# Patient Record
Sex: Female | Born: 1950 | Race: White | Hispanic: No | Marital: Married | Smoking: Never smoker
Health system: Southern US, Community
[De-identification: ages and names within clinical notes are randomized; demographics above are authoritative.]

## PROBLEM LIST (undated history)

## (undated) DIAGNOSIS — I499 Cardiac arrhythmia, unspecified: Secondary | ICD-10-CM

## (undated) DIAGNOSIS — K219 Gastro-esophageal reflux disease without esophagitis: Secondary | ICD-10-CM

## (undated) DIAGNOSIS — T8859XA Other complications of anesthesia, initial encounter: Secondary | ICD-10-CM

## (undated) DIAGNOSIS — F419 Anxiety disorder, unspecified: Secondary | ICD-10-CM

## (undated) DIAGNOSIS — I10 Essential (primary) hypertension: Secondary | ICD-10-CM

## (undated) DIAGNOSIS — I4891 Unspecified atrial fibrillation: Secondary | ICD-10-CM

## (undated) DIAGNOSIS — M1990 Unspecified osteoarthritis, unspecified site: Secondary | ICD-10-CM

## (undated) DIAGNOSIS — R112 Nausea with vomiting, unspecified: Secondary | ICD-10-CM

## (undated) DIAGNOSIS — Z8739 Personal history of other diseases of the musculoskeletal system and connective tissue: Secondary | ICD-10-CM

## (undated) DIAGNOSIS — Z9889 Other specified postprocedural states: Secondary | ICD-10-CM

## (undated) HISTORY — DX: Unspecified atrial fibrillation: I48.91

## (undated) HISTORY — DX: Anxiety disorder, unspecified: F41.9

## (undated) HISTORY — DX: Personal history of other diseases of the musculoskeletal system and connective tissue: Z87.39

---

## 1998-07-08 HISTORY — PX: KNEE SURGERY: SHX244

## 2017-05-07 ENCOUNTER — Encounter: Payer: Self-pay | Admitting: Gastroenterology

## 2017-07-02 NOTE — Progress Notes (Signed)
Added medications from referral paperwork

## 2017-07-11 ENCOUNTER — Ambulatory Visit: Payer: Self-pay | Admitting: Gastroenterology

## 2020-05-11 ENCOUNTER — Ambulatory Visit: Payer: Self-pay | Admitting: Student

## 2020-05-22 ENCOUNTER — Ambulatory Visit: Payer: Self-pay | Admitting: Student

## 2020-05-22 NOTE — H&P (Signed)
TOTAL HIP ADMISSION H&P  Patient is admitted for right total hip arthroplasty.  Subjective:  Chief Complaint: right hip pain  HPI: Melissa Price, 69 y.o. female, has a history of pain and functional disability in the right hip(s) due to arthritis and patient has failed non-surgical conservative treatments for greater than 12 weeks to include NSAID's and/or analgesics, corticosteriod injections and activity modification.  Onset of symptoms was gradual starting 3 years ago with gradually worsening course since that time.The patient noted no past surgery on the right hip(s).  Patient currently rates pain in the right hip at 8 out of 10 with activity. Patient has worsening of pain with activity and weight bearing, pain that interfers with activities of daily living and pain with passive range of motion. Patient has evidence of subchondral sclerosis and joint space narrowing by imaging studies. This condition presents safety issues increasing the risk of falls. There is no current active infection.  There are no problems to display for this patient.  Past Medical History:  Diagnosis Date  . Arthritis   . Atrial fibrillation (HCC)   . Complication of anesthesia   . Dysrhythmia    afib   . GERD (gastroesophageal reflux disease)   . History of osteopenia    both hips  . Hypertension   . PONV (postoperative nausea and vomiting)     Past Surgical History:  Procedure Laterality Date  . KNEE SURGERY Right 2000   Dr. Ronney Asters - Arthroscopic    No current facility-administered medications for this visit.   No current outpatient medications on file.   Facility-Administered Medications Ordered in Other Visits  Medication Dose Route Frequency Provider Last Rate Last Admin  . 0.9 %  sodium chloride infusion   Intravenous Continuous Barrie Dunker B, PA      . acetaminophen (TYLENOL) tablet 1,000 mg  1,000 mg Oral Once Ellender, Catheryn Bacon, MD      . lactated ringers infusion   Intravenous  Continuous Bethena Midget, MD 10 mL/hr at 06/14/20 0653 New Bag at 06/14/20 0277  . povidone-iodine 10 % swab 2 application  2 application Topical Once Barrie Dunker B, [place]      . tranexamic acid (CYKLOKAPRON) IVPB 1,000 mg  1,000 mg Intravenous To OR Darrick Grinder, PA      . vancomycin (VANCOCIN) IVPB 1000 mg/200 mL premix  1,000 mg Intravenous On Call to OR Barrie Dunker B, PA   1,000 mg at 06/14/20 0745   Allergies  Allergen Reactions  . Penicillins Shortness Of Breath    amoxicillin    Social History   Tobacco Use  . Smoking status: Never Smoker  . Smokeless tobacco: Never Used  Substance Use Topics  . Alcohol use: Yes    Comment: occasoinal     Family History  Problem Relation Age of Onset  . Heart disease Mother   . Heart disease Father      Review of Systems  Musculoskeletal: Positive for arthralgias.  All other systems reviewed and are negative.   Objective:  Physical Exam Constitutional:      Appearance: Normal appearance.  HENT:     Head: Normocephalic.  Eyes:     Pupils: Pupils are equal, round, and reactive to light.  Cardiovascular:     Rate and Rhythm: Normal rate.     Heart sounds: Normal heart sounds.  Pulmonary:     Breath sounds: Normal breath sounds.  Abdominal:     Palpations: Abdomen is soft.  Tenderness: There is no abdominal tenderness.  Genitourinary:    Comments: Deferred Musculoskeletal:     Cervical back: Normal range of motion.     Comments: Examination of the right hip reveals no skin wounds or lesions. Mild trochanteric tenderness to palpation. Restricted range of motion of the hip. Pain with terminal flexion and rotation. Pain in the position of impingement. Positive Stinchfield.  Skin:    General: Skin is warm and dry.  Neurological:     Mental Status: She is alert and oriented to person, place, and time.  Psychiatric:        Mood and Affect: Mood normal.     Vital signs in last 24  hours: @VSRANGES @  Labs:   Estimated body mass index is 22.64 kg/m as calculated from the following:   Height as of 06/14/20: 5\' 3"  (1.6 m).   Weight as of 06/14/20: 58 kg.   Imaging Review Plain radiographs demonstrate severe degenerative joint disease of the right hip(s). The bone quality appears to be adequate for age and reported activity level.      Assessment/Plan:  End stage arthritis, right hip(s)  The patient history, physical examination, clinical judgement of the provider and imaging studies are consistent with end stage degenerative joint disease of the right hip(s) and total hip arthroplasty is deemed medically necessary. The treatment options including medical management, injection therapy, arthroscopy and arthroplasty were discussed at length. The risks and benefits of total hip arthroplasty were presented and reviewed. The risks due to aseptic loosening, infection, stiffness, dislocation/subluxation,  thromboembolic complications and other imponderables were discussed.  The patient acknowledged the explanation, agreed to proceed with the plan and consent was signed. Patient is being admitted for inpatient treatment for surgery, pain control, PT, OT, prophylactic antibiotics, VTE prophylaxis, progressive ambulation and ADL's and discharge planning.The patient is planning to be discharged home after overnight observation where she will complete a home exercise program

## 2020-05-22 NOTE — H&P (View-Only) (Signed)
TOTAL HIP ADMISSION H&P  Patient is admitted for right total hip arthroplasty.  Subjective:  Chief Complaint: right hip pain  HPI: Melissa Price, 69 y.o. female, has a history of pain and functional disability in the right hip(s) due to arthritis and patient has failed non-surgical conservative treatments for greater than 12 weeks to include NSAID's and/or analgesics, corticosteriod injections and activity modification.  Onset of symptoms was gradual starting 3 years ago with gradually worsening course since that time.The patient noted no past surgery on the right hip(s).  Patient currently rates pain in the right hip at 8 out of 10 with activity. Patient has worsening of pain with activity and weight bearing, pain that interfers with activities of daily living and pain with passive range of motion. Patient has evidence of subchondral sclerosis and joint space narrowing by imaging studies. This condition presents safety issues increasing the risk of falls. There is no current active infection.  There are no problems to display for this patient.  Past Medical History:  Diagnosis Date  . Arthritis   . Atrial fibrillation (HCC)   . Complication of anesthesia   . Dysrhythmia    afib   . GERD (gastroesophageal reflux disease)   . History of osteopenia    both hips  . Hypertension   . PONV (postoperative nausea and vomiting)     Past Surgical History:  Procedure Laterality Date  . KNEE SURGERY Right 2000   Dr. Ronney Asters - Arthroscopic    No current facility-administered medications for this visit.   No current outpatient medications on file.   Facility-Administered Medications Ordered in Other Visits  Medication Dose Route Frequency Provider Last Rate Last Admin  . 0.9 %  sodium chloride infusion   Intravenous Continuous Barrie Dunker B, PA      . acetaminophen (TYLENOL) tablet 1,000 mg  1,000 mg Oral Once Ellender, Catheryn Bacon, MD      . lactated ringers infusion   Intravenous  Continuous Bethena Midget, MD 10 mL/hr at 06/14/20 0653 New Bag at 06/14/20 0277  . povidone-iodine 10 % swab 2 application  2 application Topical Once Barrie Dunker B, [place]      . tranexamic acid (CYKLOKAPRON) IVPB 1,000 mg  1,000 mg Intravenous To OR Darrick Grinder, PA      . vancomycin (VANCOCIN) IVPB 1000 mg/200 mL premix  1,000 mg Intravenous On Call to OR Barrie Dunker B, PA   1,000 mg at 06/14/20 0745   Allergies  Allergen Reactions  . Penicillins Shortness Of Breath    amoxicillin    Social History   Tobacco Use  . Smoking status: Never Smoker  . Smokeless tobacco: Never Used  Substance Use Topics  . Alcohol use: Yes    Comment: occasoinal     Family History  Problem Relation Age of Onset  . Heart disease Mother   . Heart disease Father      Review of Systems  Musculoskeletal: Positive for arthralgias.  All other systems reviewed and are negative.   Objective:  Physical Exam Constitutional:      Appearance: Normal appearance.  HENT:     Head: Normocephalic.  Eyes:     Pupils: Pupils are equal, round, and reactive to light.  Cardiovascular:     Rate and Rhythm: Normal rate.     Heart sounds: Normal heart sounds.  Pulmonary:     Breath sounds: Normal breath sounds.  Abdominal:     Palpations: Abdomen is soft.  Tenderness: There is no abdominal tenderness.  Genitourinary:    Comments: Deferred Musculoskeletal:     Cervical back: Normal range of motion.     Comments: Examination of the right hip reveals no skin wounds or lesions. Mild trochanteric tenderness to palpation. Restricted range of motion of the hip. Pain with terminal flexion and rotation. Pain in the position of impingement. Positive Stinchfield.  Skin:    General: Skin is warm and dry.  Neurological:     Mental Status: She is alert and oriented to person, place, and time.  Psychiatric:        Mood and Affect: Mood normal.     Vital signs in last 24  hours: @VSRANGES@  Labs:   Estimated body mass index is 22.64 kg/m as calculated from the following:   Height as of 06/14/20: 5' 3" (1.6 m).   Weight as of 06/14/20: 58 kg.   Imaging Review Plain radiographs demonstrate severe degenerative joint disease of the right hip(s). The bone quality appears to be adequate for age and reported activity level.      Assessment/Plan:  End stage arthritis, right hip(s)  The patient history, physical examination, clinical judgement of the provider and imaging studies are consistent with end stage degenerative joint disease of the right hip(s) and total hip arthroplasty is deemed medically necessary. The treatment options including medical management, injection therapy, arthroscopy and arthroplasty were discussed at length. The risks and benefits of total hip arthroplasty were presented and reviewed. The risks due to aseptic loosening, infection, stiffness, dislocation/subluxation,  thromboembolic complications and other imponderables were discussed.  The patient acknowledged the explanation, agreed to proceed with the plan and consent was signed. Patient is being admitted for inpatient treatment for surgery, pain control, PT, OT, prophylactic antibiotics, VTE prophylaxis, progressive ambulation and ADL's and discharge planning.The patient is planning to be discharged home after overnight observation where she will complete a home exercise program   

## 2020-06-06 ENCOUNTER — Other Ambulatory Visit: Payer: Self-pay

## 2020-06-06 ENCOUNTER — Encounter (HOSPITAL_COMMUNITY): Payer: Self-pay

## 2020-06-06 ENCOUNTER — Encounter (HOSPITAL_COMMUNITY)
Admission: RE | Admit: 2020-06-06 | Discharge: 2020-06-06 | Disposition: A | Discharge status: Home / Self Care | Payer: Medicare PPO | Source: Ambulatory Visit | Attending: Orthopedic Surgery | Admitting: Orthopedic Surgery

## 2020-06-06 DIAGNOSIS — Z01818 Encounter for other preprocedural examination: Secondary | ICD-10-CM | POA: Diagnosis present

## 2020-06-06 HISTORY — DX: Cardiac arrhythmia, unspecified: I49.9

## 2020-06-06 HISTORY — DX: Essential (primary) hypertension: I10

## 2020-06-06 HISTORY — DX: Unspecified osteoarthritis, unspecified site: M19.90

## 2020-06-06 HISTORY — DX: Other specified postprocedural states: Z98.890

## 2020-06-06 HISTORY — DX: Other complications of anesthesia, initial encounter: T88.59XA

## 2020-06-06 HISTORY — DX: Gastro-esophageal reflux disease without esophagitis: K21.9

## 2020-06-06 HISTORY — DX: Other specified postprocedural states: R11.2

## 2020-06-06 LAB — COMPREHENSIVE METABOLIC PANEL
ALT: 17 U/L (ref 0–44)
AST: 21 U/L (ref 15–41)
Albumin: 4.5 g/dL (ref 3.5–5.0)
Alkaline Phosphatase: 75 U/L (ref 38–126)
Anion gap: 8 (ref 5–15)
BUN: 18 mg/dL (ref 8–23)
CO2: 27 mmol/L (ref 22–32)
Calcium: 9.8 mg/dL (ref 8.9–10.3)
Chloride: 102 mmol/L (ref 98–111)
Creatinine, Ser: 0.83 mg/dL (ref 0.44–1.00)
GFR, Estimated: >60 mL/min (ref >60)
Glucose, Bld: 113 mg/dL — ABNORMAL HIGH (ref 70–99)
Potassium: 4.4 mmol/L (ref 3.5–5.1)
Sodium: 137 mmol/L (ref 135–145)
Total Bilirubin: 0.5 mg/dL (ref 0.3–1.2)
Total Protein: 7.2 g/dL (ref 6.5–8.1)

## 2020-06-06 LAB — URINALYSIS, ROUTINE W REFLEX MICROSCOPIC
Bilirubin Urine: NEGATIVE
Glucose, UA: NEGATIVE mg/dL
Hgb urine dipstick: NEGATIVE
Ketones, ur: NEGATIVE mg/dL
Leukocytes,Ua: NEGATIVE
Nitrite: NEGATIVE
Protein, ur: NEGATIVE mg/dL
Specific Gravity, Urine: 1.014 (ref 1.005–1.030)
pH: 5 (ref 5.0–8.0)

## 2020-06-06 LAB — CBC
HCT: 37.7 % (ref 36.0–46.0)
Hemoglobin: 12.2 g/dL (ref 12.0–15.0)
MCH: 32.4 pg (ref 26.0–34.0)
MCHC: 32.4 g/dL (ref 30.0–36.0)
MCV: 100 fL (ref 80.0–100.0)
Platelets: 289 10*3/uL (ref 150–400)
RBC: 3.77 MIL/uL — ABNORMAL LOW (ref 3.87–5.11)
RDW: 13 % (ref 11.5–15.5)
WBC: 5.7 10*3/uL (ref 4.0–10.5)
nRBC: 0 % (ref 0.0–0.2)

## 2020-06-06 LAB — PROTIME-INR
INR: 1.3 — ABNORMAL HIGH (ref 0.8–1.2)
Prothrombin Time: 16 seconds — ABNORMAL HIGH (ref 11.4–15.2)

## 2020-06-06 LAB — SURGICAL PCR SCREEN
MRSA, PCR: NEGATIVE
Staphylococcus aureus: NEGATIVE

## 2020-06-06 NOTE — Progress Notes (Addendum)
Anesthesia Review:  PCP: Cardiologist : Melissa Price [phone] Chest x-ray : EKG :12/26/19 - have requested from [place] by fax with confirmation on chart - EKG on chart.  Echo : Stress test: Cardiac Cath :  Activity level: can do a flight of stairs without difficulty  Sleep Study/ CPAP : Fasting Blood Sugar :      / Checks Blood Sugar -- times a day:   Blood Thinner/ Instructions /Last Dose: ASA / Instructions/ Last Dose :  Xarelto- lst dose on 06/11/2020 per pt  Have requested clearances and info from Surgery scheduler and office of DR Orpah Greek on 06/06/2020  Clearance from DR Orpah Greek dated 05/03/2020 along with LOV of 03/23/2020 on chart

## 2020-06-06 NOTE — Progress Notes (Signed)
DUE TO COVID-19 ONLY ONE VISITOR IS ALLOWED TO COME WITH YOU AND STAY IN THE WAITING ROOM ONLY DURING PRE OP AND PROCEDURE DAY OF SURGERY. THE 1 VISITOR  MAY VISIT WITH YOU AFTER SURGERY IN YOUR PRIVATE ROOM DURING VISITING HOURS ONLY!  YOU NEED TO HAVE A COVID 19 TEST ON12/10/2019 _______ @_______ , THIS TEST MUST BE DONE BEFORE SURGERY,  COVID TESTING SITE [address] [place] , IT IS ON THE RIGHT GOING OUT [address] APPROXIMATELY  2 MINUTES PAST [employer] ON THE RIGHT. ONCE YOUR COVID TEST IS COMPLETED,  PLEASE BEGIN THE QUARANTINE INSTRUCTIONS AS OUTLINED IN YOUR HANDOUT.                Melissa Price  06/06/2020   Your procedure is scheduled on: 06/14/2020   Report to [facility] Main  Entrance   Report to admitting at     0600AM  AM     Call this number if you have problems the morning of surgery [phone]    REMEMBER: NO  SOLID FOOD CANDY OR GUM AFTER MIDNIGHT. CLEAR LIQUIDS UNTIL     0530AM        . NOTHING BY MOUTH EXCEPT CLEAR LIQUIDS UNTIL    . PLEASE FINISH ENSURE DRINK PER SURGEON ORDER  WHICH NEEDS TO BE COMPLETED AT  0530AM     .      CLEAR LIQUID DIET   Foods Allowed                                                                    Coffee and tea, regular and decaf                            Fruit ices (not with fruit pulp)                                      Iced Popsicles                                    Carbonated beverages, regular and diet                                    Cranberry, grape and apple juices Sports drinks like Gatorade Lightly seasoned clear broth or consume(fat free) Sugar, honey syrup ___________________________________________________________________      BRUSH YOUR TEETH MORNING OF SURGERY AND RINSE YOUR MOUTH OUT, NO CHEWING GUM CANDY OR MINTS.     Take these medicines the morning of surgery with A SIP OF WATER:  AMLODIPINE, METOPROLOL, EYE DROPS AS USUAL  DO NOT TAKE ANY DIABETIC  MEDICATIONS DAY OF YOUR SURGERY                               You may not have any metal on your body including hair pins and  piercings  Do not wear jewelry, make-up, lotions, powders or perfumes, deodorant             Do not wear nail polish on your fingernails.  Do not shave  48 hours prior to surgery.              Men may shave face and neck.   Do not bring valuables to the hospital. [place].  Contacts, dentures or bridgework may not be worn into surgery.  Leave suitcase in the car. After surgery it may be brought to your room.     Patients discharged the day of surgery will not be allowed to drive home. IF YOU ARE HAVING SURGERY AND GOING HOME THE SAME DAY, YOU MUST HAVE AN ADULT TO DRIVE YOU HOME AND BE WITH YOU FOR 24 HOURS. YOU MAY GO HOME BY TAXI OR UBER OR ORTHERWISE, BUT AN ADULT MUST ACCOMPANY YOU HOME AND STAY WITH YOU FOR 24 HOURS.  Name and phone number of your driver:  Special Instructions: N/A              Please read over the following fact sheets you were given: _____________________________________________________________________  [facility] - Preparing for Surgery Before surgery, you can play an important role.  Because skin is not sterile, your skin needs to be as free of germs as possible.  You can reduce the number of germs on your skin by washing with CHG (chlorahexidine gluconate) soap before surgery.  CHG is an antiseptic cleaner which kills germs and bonds with the skin to continue killing germs even after washing. Please DO NOT use if you have an allergy to CHG or antibacterial soaps.  If your skin becomes reddened/irritated stop using the CHG and inform your nurse when you arrive at Short Stay. Do not shave (including legs and underarms) for at least 48 hours prior to the first CHG shower.  You may shave your face/neck. Please follow these instructions carefully:  1.  Shower with CHG Soap the night  before surgery and the  morning of Surgery.  2.  If you choose to wash your hair, wash your hair first as usual with your  normal  shampoo.  3.  After you shampoo, rinse your hair and body thoroughly to remove the  shampoo.                           4.  Use CHG as you would any other liquid soap.  You can apply chg directly  to the skin and wash                       Gently with a scrungie or clean washcloth.  5.  Apply the CHG Soap to your body ONLY FROM THE NECK DOWN.   Do not use on face/ open                           Wound or open sores. Avoid contact with eyes, ears mouth and genitals (private parts).                       Wash face,  Genitals (private parts) with your normal soap.             6.  Wash thoroughly, paying special attention to the area where your surgery  will be performed.  7.  Thoroughly rinse your body with warm water from the neck down.  8.  DO NOT shower/wash with your normal soap after using and rinsing off  the CHG Soap.                9.  Pat yourself dry with a clean towel.            10.  Wear clean pajamas.            11.  Place clean sheets on your bed the night of your first shower and do not  sleep with pets. Day of Surgery : Do not apply any lotions/deodorants the morning of surgery.  Please wear clean clothes to the hospital/surgery center.  FAILURE TO FOLLOW THESE INSTRUCTIONS MAY RESULT IN THE CANCELLATION OF YOUR SURGERY PATIENT SIGNATURE_________________________________  NURSE SIGNATURE__________________________________  ________________________________________________________________________

## 2020-06-10 ENCOUNTER — Other Ambulatory Visit (HOSPITAL_COMMUNITY)
Admission: RE | Admit: 2020-06-10 | Discharge: 2020-06-10 | Disposition: A | Discharge status: Home / Self Care | Payer: Medicare PPO | Source: Ambulatory Visit | Attending: Orthopedic Surgery | Admitting: Orthopedic Surgery

## 2020-06-10 DIAGNOSIS — Z01812 Encounter for preprocedural laboratory examination: Secondary | ICD-10-CM | POA: Insufficient documentation

## 2020-06-10 DIAGNOSIS — Z20822 Contact with and (suspected) exposure to covid-19: Secondary | ICD-10-CM | POA: Insufficient documentation

## 2020-06-10 LAB — SARS CORONAVIRUS 2 (TAT 6-24 HRS): SARS Coronavirus 2: NEGATIVE

## 2020-06-12 NOTE — Progress Notes (Signed)
Left another voice mail message asking pt to call nurse back in PST about misplaced instructions.

## 2020-06-12 NOTE — Progress Notes (Signed)
Pt called and Left message with Scheduler stating she had misplaced her preop instructions and Ensure fo rsurgery. LVMM for pt to call me back.

## 2020-06-13 NOTE — Anesthesia Preprocedure Evaluation (Addendum)
Anesthesia Evaluation  Patient identified by MRN, date of birth, ID band Patient awake    Reviewed: Allergy & Precautions, NPO status , Patient's Chart, lab work & pertinent test results  History of Anesthesia Complications (+) PONV and history of anesthetic complications  Airway Mallampati: I  TM Distance: >3 FB Neck ROM: Full    Dental no notable dental hx.    Pulmonary neg pulmonary ROS,    Pulmonary exam normal breath sounds clear to auscultation       Cardiovascular hypertension, Pt. on medications and Pt. on home beta blockers Normal cardiovascular exam+ dysrhythmias Atrial Fibrillation  Rhythm:Regular Rate:Normal     Neuro/Psych negative neurological ROS  negative psych ROS   GI/Hepatic negative GI ROS, Neg liver ROS,   Endo/Other  negative endocrine ROS  Renal/GU negative Renal ROS     Musculoskeletal  (+) Arthritis ,   Abdominal   Peds  Hematology  (+) Blood dyscrasia, , HLD   Anesthesia Other Findings degenerative joint disease right hip  Reproductive/Obstetrics                            Anesthesia Physical Anesthesia Plan  ASA: III  Anesthesia Plan: General   Post-op Pain Management:    Induction: Intravenous  PONV Risk Score and Plan: 4 or greater and Ondansetron, Dexamethasone, Treatment may vary due to age or medical condition and Propofol infusion  Airway Management Planned: Oral ETT  Additional Equipment:   Intra-op Plan:   Post-operative Plan: Extubation in OR  Informed Consent: I have reviewed the patients History and Physical, chart, labs and discussed the procedure including the risks, benefits and alternatives for the proposed anesthesia with the patient or authorized representative who has indicated his/her understanding and acceptance.     Dental advisory given  Plan Discussed with: CRNA  Anesthesia Plan Comments: (Last dose of rivaroxaban  around 10 pm on Sunday 06/11/20)        Anesthesia Quick Evaluation

## 2020-06-14 ENCOUNTER — Ambulatory Visit (HOSPITAL_COMMUNITY): Payer: Medicare PPO | Admitting: Physician Assistant

## 2020-06-14 ENCOUNTER — Encounter (HOSPITAL_COMMUNITY)
Admission: AD | Disposition: A | Discharge status: Home / Self Care | Payer: Self-pay | Source: Home / Self Care | Attending: Orthopedic Surgery

## 2020-06-14 ENCOUNTER — Ambulatory Visit (HOSPITAL_COMMUNITY): Payer: Medicare PPO

## 2020-06-14 ENCOUNTER — Ambulatory Visit (HOSPITAL_COMMUNITY): Payer: Medicare PPO | Admitting: Anesthesiology

## 2020-06-14 ENCOUNTER — Inpatient Hospital Stay (HOSPITAL_COMMUNITY)
Admission: AD | Admit: 2020-06-14 | Discharge: 2020-06-17 | DRG: 470 | Disposition: A | Discharge status: Home / Self Care | Payer: Medicare PPO | Attending: Orthopedic Surgery | Admitting: Orthopedic Surgery

## 2020-06-14 ENCOUNTER — Other Ambulatory Visit: Payer: Self-pay

## 2020-06-14 ENCOUNTER — Encounter (HOSPITAL_COMMUNITY): Payer: Self-pay | Admitting: Orthopedic Surgery

## 2020-06-14 DIAGNOSIS — I493 Ventricular premature depolarization: Secondary | ICD-10-CM

## 2020-06-14 DIAGNOSIS — I495 Sick sinus syndrome: Secondary | ICD-10-CM

## 2020-06-14 DIAGNOSIS — I959 Hypotension, unspecified: Secondary | ICD-10-CM | POA: Diagnosis not present

## 2020-06-14 DIAGNOSIS — Z20822 Contact with and (suspected) exposure to covid-19: Secondary | ICD-10-CM | POA: Diagnosis present

## 2020-06-14 DIAGNOSIS — D62 Acute posthemorrhagic anemia: Secondary | ICD-10-CM | POA: Diagnosis not present

## 2020-06-14 DIAGNOSIS — S72041A Displaced fracture of base of neck of right femur, initial encounter for closed fracture: Secondary | ICD-10-CM

## 2020-06-14 DIAGNOSIS — K219 Gastro-esophageal reflux disease without esophagitis: Secondary | ICD-10-CM | POA: Diagnosis present

## 2020-06-14 DIAGNOSIS — E1165 Type 2 diabetes mellitus with hyperglycemia: Secondary | ICD-10-CM | POA: Diagnosis present

## 2020-06-14 DIAGNOSIS — D649 Anemia, unspecified: Secondary | ICD-10-CM

## 2020-06-14 DIAGNOSIS — R001 Bradycardia, unspecified: Secondary | ICD-10-CM | POA: Diagnosis not present

## 2020-06-14 DIAGNOSIS — I272 Pulmonary hypertension, unspecified: Secondary | ICD-10-CM

## 2020-06-14 DIAGNOSIS — E785 Hyperlipidemia, unspecified: Secondary | ICD-10-CM | POA: Diagnosis present

## 2020-06-14 DIAGNOSIS — Z7901 Long term (current) use of anticoagulants: Secondary | ICD-10-CM

## 2020-06-14 DIAGNOSIS — Z09 Encounter for follow-up examination after completed treatment for conditions other than malignant neoplasm: Secondary | ICD-10-CM

## 2020-06-14 DIAGNOSIS — E871 Hypo-osmolality and hyponatremia: Secondary | ICD-10-CM | POA: Diagnosis present

## 2020-06-14 DIAGNOSIS — E860 Dehydration: Secondary | ICD-10-CM | POA: Diagnosis present

## 2020-06-14 DIAGNOSIS — M1611 Unilateral primary osteoarthritis, right hip: Principal | ICD-10-CM | POA: Diagnosis present

## 2020-06-14 DIAGNOSIS — Z79899 Other long term (current) drug therapy: Secondary | ICD-10-CM

## 2020-06-14 DIAGNOSIS — I498 Other specified cardiac arrhythmias: Secondary | ICD-10-CM

## 2020-06-14 DIAGNOSIS — I471 Supraventricular tachycardia: Secondary | ICD-10-CM | POA: Diagnosis not present

## 2020-06-14 DIAGNOSIS — I480 Paroxysmal atrial fibrillation: Secondary | ICD-10-CM | POA: Diagnosis present

## 2020-06-14 DIAGNOSIS — I10 Essential (primary) hypertension: Secondary | ICD-10-CM | POA: Diagnosis present

## 2020-06-14 HISTORY — PX: TOTAL HIP ARTHROPLASTY: SHX124

## 2020-06-14 LAB — ABO/RH: ABO/RH(D): O NEG

## 2020-06-14 SURGERY — ARTHROPLASTY, HIP, TOTAL, ANTERIOR APPROACH
Anesthesia: General | Site: Hip | Laterality: Right

## 2020-06-14 MED ORDER — ONDANSETRON HCL 4 MG PO TABS: 4.0000 mg | ORAL_TABLET | Freq: Four times a day (QID) | ORAL | Status: DC | PRN

## 2020-06-14 MED ORDER — PROPOFOL 10 MG/ML IV BOLUS
INTRAVENOUS | Status: DC | PRN
  Administered 2020-06-14: 100 mg via INTRAVENOUS

## 2020-06-14 MED ORDER — MENTHOL 3 MG MT LOZG: 1.0000 | LOZENGE | OROMUCOSAL | Status: DC | PRN

## 2020-06-14 MED ORDER — ROCURONIUM BROMIDE 10 MG/ML (PF) SYRINGE
PREFILLED_SYRINGE | INTRAVENOUS | Status: AC
  Filled 2020-06-14: qty 10

## 2020-06-14 MED ORDER — ALBUMIN HUMAN 5 % IV SOLN
INTRAVENOUS | Status: AC
  Filled 2020-06-14: qty 250

## 2020-06-14 MED ORDER — PHENYLEPHRINE 40 MCG/ML (10ML) SYRINGE FOR IV PUSH (FOR BLOOD PRESSURE SUPPORT)
PREFILLED_SYRINGE | INTRAVENOUS | Status: DC | PRN
  Administered 2020-06-14 (×3): 40 ug via INTRAVENOUS
  Administered 2020-06-14 (×2): 80 ug via INTRAVENOUS
  Administered 2020-06-14: 40 ug via INTRAVENOUS

## 2020-06-14 MED ORDER — EPHEDRINE SULFATE-NACL 50-0.9 MG/10ML-% IV SOSY
PREFILLED_SYRINGE | INTRAVENOUS | Status: DC | PRN
  Administered 2020-06-14 (×2): 5 mg via INTRAVENOUS

## 2020-06-14 MED ORDER — ATORVASTATIN CALCIUM 40 MG PO TABS
40.0000 mg | ORAL_TABLET | Freq: Every day | ORAL | Status: DC
  Administered 2020-06-14 – 2020-06-16 (×3): 40 mg via ORAL
  Filled 2020-06-14 (×3): qty 1

## 2020-06-14 MED ORDER — KETOROLAC TROMETHAMINE 30 MG/ML IJ SOLN
INTRAMUSCULAR | Status: DC | PRN
  Administered 2020-06-14: 30 mg via INTRAVENOUS

## 2020-06-14 MED ORDER — ONDANSETRON HCL 4 MG/2ML IJ SOLN
INTRAMUSCULAR | Status: AC
  Filled 2020-06-14: qty 2

## 2020-06-14 MED ORDER — TRANEXAMIC ACID-NACL 1000-0.7 MG/100ML-% IV SOLN
1000.0000 mg | INTRAVENOUS | Status: AC
  Administered 2020-06-14: 1000 mg via INTRAVENOUS
  Filled 2020-06-14: qty 100

## 2020-06-14 MED ORDER — CLINDAMYCIN PHOSPHATE 600 MG/50ML IV SOLN
600.0000 mg | Freq: Four times a day (QID) | INTRAVENOUS | Status: AC
  Administered 2020-06-14 – 2020-06-15 (×2): 600 mg via INTRAVENOUS
  Filled 2020-06-14 (×2): qty 50

## 2020-06-14 MED ORDER — HYDROCODONE-ACETAMINOPHEN 5-325 MG PO TABS
1.0000 | ORAL_TABLET | ORAL | Status: DC | PRN
  Administered 2020-06-14 – 2020-06-16 (×3): 1 via ORAL
  Administered 2020-06-17 (×2): 2 via ORAL
  Filled 2020-06-14: qty 1
  Filled 2020-06-14 (×2): qty 2
  Filled 2020-06-14 (×2): qty 1

## 2020-06-14 MED ORDER — FENTANYL CITRATE (PF) 100 MCG/2ML IJ SOLN
INTRAMUSCULAR | Status: AC
  Filled 2020-06-14: qty 2

## 2020-06-14 MED ORDER — WATER FOR IRRIGATION, STERILE IR SOLN
Status: DC | PRN
  Administered 2020-06-14: 2000 mL

## 2020-06-14 MED ORDER — ISOPROPYL ALCOHOL 70 % SOLN
Status: AC
  Filled 2020-06-14: qty 480

## 2020-06-14 MED ORDER — MONTELUKAST SODIUM 10 MG PO TABS: 10.0000 mg | ORAL_TABLET | Freq: Every day | ORAL | Status: DC | PRN

## 2020-06-14 MED ORDER — MIDAZOLAM HCL 2 MG/2ML IJ SOLN
INTRAMUSCULAR | Status: AC
  Filled 2020-06-14: qty 2

## 2020-06-14 MED ORDER — SODIUM CHLORIDE (PF) 0.9 % IJ SOLN
INTRAMUSCULAR | Status: AC
  Filled 2020-06-14: qty 50

## 2020-06-14 MED ORDER — ACETAMINOPHEN 10 MG/ML IV SOLN: 1000.0000 mg | Freq: Once | INTRAVENOUS | Status: DC | PRN

## 2020-06-14 MED ORDER — HYDROCODONE-ACETAMINOPHEN 7.5-325 MG PO TABS
1.0000 | ORAL_TABLET | ORAL | Status: DC | PRN
  Administered 2020-06-15 – 2020-06-16 (×3): 1 via ORAL
  Administered 2020-06-16: 2 via ORAL
  Filled 2020-06-14 (×2): qty 1
  Filled 2020-06-14: qty 2
  Filled 2020-06-14: qty 1

## 2020-06-14 MED ORDER — AMLODIPINE BESYLATE 5 MG PO TABS: 5.0000 mg | ORAL_TABLET | Freq: Every day | ORAL | Status: DC

## 2020-06-14 MED ORDER — ALBUMIN HUMAN 5 % IV SOLN: INTRAVENOUS | Status: DC | PRN

## 2020-06-14 MED ORDER — KETOROLAC TROMETHAMINE 30 MG/ML IJ SOLN
INTRAMUSCULAR | Status: AC
  Filled 2020-06-14: qty 1

## 2020-06-14 MED ORDER — CHLORHEXIDINE GLUCONATE 0.12 % MT SOLN
15.0000 mL | Freq: Once | OROMUCOSAL | Status: AC
  Administered 2020-06-14: 15 mL via OROMUCOSAL

## 2020-06-14 MED ORDER — GLYCOPYRROLATE PF 0.2 MG/ML IJ SOSY
PREFILLED_SYRINGE | INTRAMUSCULAR | Status: DC | PRN
  Administered 2020-06-14: .2 mg via INTRAVENOUS

## 2020-06-14 MED ORDER — 0.9 % SODIUM CHLORIDE (POUR BTL) OPTIME
TOPICAL | Status: DC | PRN
  Administered 2020-06-14: 1000 mL

## 2020-06-14 MED ORDER — ISOPROPYL ALCOHOL 70 % SOLN
Status: DC | PRN
  Administered 2020-06-14: 1 via TOPICAL

## 2020-06-14 MED ORDER — PROPOFOL 500 MG/50ML IV EMUL
INTRAVENOUS | Status: AC
  Filled 2020-06-14: qty 50

## 2020-06-14 MED ORDER — SODIUM CHLORIDE 0.9 % IR SOLN
Status: DC | PRN
  Administered 2020-06-14: 3000 mL

## 2020-06-14 MED ORDER — ONDANSETRON HCL 4 MG/2ML IJ SOLN: 4.0000 mg | Freq: Once | INTRAMUSCULAR | Status: DC | PRN

## 2020-06-14 MED ORDER — VANCOMYCIN HCL IN DEXTROSE 1-5 GM/200ML-% IV SOLN
1000.0000 mg | INTRAVENOUS | Status: AC
  Administered 2020-06-14: 1000 mg via INTRAVENOUS
  Filled 2020-06-14: qty 200

## 2020-06-14 MED ORDER — PHENOL 1.4 % MT LIQD: 1.0000 | OROMUCOSAL | Status: DC | PRN

## 2020-06-14 MED ORDER — KETOROLAC TROMETHAMINE 15 MG/ML IJ SOLN
7.5000 mg | Freq: Four times a day (QID) | INTRAMUSCULAR | Status: AC
  Administered 2020-06-14 – 2020-06-15 (×4): 7.5 mg via INTRAVENOUS
  Filled 2020-06-14 (×4): qty 1

## 2020-06-14 MED ORDER — METHOCARBAMOL 500 MG IVPB - SIMPLE MED
500.0000 mg | Freq: Four times a day (QID) | INTRAVENOUS | Status: DC | PRN
  Administered 2020-06-14: 500 mg via INTRAVENOUS
  Filled 2020-06-14: qty 50

## 2020-06-14 MED ORDER — CEFAZOLIN SODIUM-DEXTROSE 2-3 GM-%(50ML) IV SOLR
INTRAVENOUS | Status: DC | PRN
  Administered 2020-06-14: 2 g via INTRAVENOUS

## 2020-06-14 MED ORDER — SODIUM CHLORIDE (PF) 0.9 % IJ SOLN
INTRAMUSCULAR | Status: DC | PRN
  Administered 2020-06-14: 30 mL via INTRAVENOUS

## 2020-06-14 MED ORDER — LIDOCAINE HCL (PF) 2 % IJ SOLN
INTRAMUSCULAR | Status: AC
  Filled 2020-06-14: qty 5

## 2020-06-14 MED ORDER — PROPOFOL 10 MG/ML IV BOLUS
INTRAVENOUS | Status: AC
  Filled 2020-06-14: qty 20

## 2020-06-14 MED ORDER — LIDOCAINE 2% (20 MG/ML) 5 ML SYRINGE
INTRAMUSCULAR | Status: DC | PRN
  Administered 2020-06-14: 60 mg via INTRAVENOUS

## 2020-06-14 MED ORDER — POVIDONE-IODINE 10 % EX SWAB
2.0000 "application " | Freq: Once | CUTANEOUS | Status: AC
  Administered 2020-06-14: 2 via TOPICAL

## 2020-06-14 MED ORDER — MIDAZOLAM HCL 2 MG/2ML IJ SOLN
INTRAMUSCULAR | Status: DC | PRN
  Administered 2020-06-14: 2 mg via INTRAVENOUS

## 2020-06-14 MED ORDER — PHENYLEPHRINE 40 MCG/ML (10ML) SYRINGE FOR IV PUSH (FOR BLOOD PRESSURE SUPPORT)
PREFILLED_SYRINGE | INTRAVENOUS | Status: AC
  Filled 2020-06-14: qty 10

## 2020-06-14 MED ORDER — POVIDONE-IODINE 10 % EX SWAB: 2.0000 "application " | Freq: Once | CUTANEOUS | Status: DC

## 2020-06-14 MED ORDER — BUPIVACAINE-EPINEPHRINE 0.25% -1:200000 IJ SOLN
INTRAMUSCULAR | Status: DC | PRN
  Administered 2020-06-14: 30 mL

## 2020-06-14 MED ORDER — DEXAMETHASONE SODIUM PHOSPHATE 10 MG/ML IJ SOLN
10.0000 mg | Freq: Once | INTRAMUSCULAR | Status: AC
  Administered 2020-06-15: 10 mg via INTRAVENOUS
  Filled 2020-06-14: qty 1

## 2020-06-14 MED ORDER — METOPROLOL TARTRATE 25 MG PO TABS
25.0000 mg | ORAL_TABLET | Freq: Two times a day (BID) | ORAL | Status: DC
  Administered 2020-06-14: 25 mg via ORAL
  Filled 2020-06-14: qty 1

## 2020-06-14 MED ORDER — SENNA 8.6 MG PO TABS
2.0000 | ORAL_TABLET | Freq: Every day | ORAL | Status: DC
  Administered 2020-06-14 – 2020-06-16 (×3): 17.2 mg via ORAL
  Filled 2020-06-14 (×3): qty 2

## 2020-06-14 MED ORDER — ONDANSETRON HCL 4 MG/2ML IJ SOLN
4.0000 mg | Freq: Four times a day (QID) | INTRAMUSCULAR | Status: DC | PRN
  Administered 2020-06-14: 4 mg via INTRAVENOUS
  Filled 2020-06-14: qty 2

## 2020-06-14 MED ORDER — METHOCARBAMOL 500 MG PO TABS
500.0000 mg | ORAL_TABLET | Freq: Four times a day (QID) | ORAL | Status: DC | PRN
  Administered 2020-06-14: 500 mg via ORAL
  Filled 2020-06-14: qty 1

## 2020-06-14 MED ORDER — ONDANSETRON HCL 4 MG/2ML IJ SOLN
INTRAMUSCULAR | Status: DC | PRN
  Administered 2020-06-14: 4 mg via INTRAVENOUS

## 2020-06-14 MED ORDER — ROCURONIUM BROMIDE 10 MG/ML (PF) SYRINGE
PREFILLED_SYRINGE | INTRAVENOUS | Status: DC | PRN
  Administered 2020-06-14: 20 mg via INTRAVENOUS
  Administered 2020-06-14: 40 mg via INTRAVENOUS

## 2020-06-14 MED ORDER — BUPIVACAINE-EPINEPHRINE (PF) 0.25% -1:200000 IJ SOLN
INTRAMUSCULAR | Status: AC
  Filled 2020-06-14: qty 30

## 2020-06-14 MED ORDER — EPHEDRINE 5 MG/ML INJ
INTRAVENOUS | Status: AC
  Filled 2020-06-14: qty 10

## 2020-06-14 MED ORDER — LACTATED RINGERS IV SOLN: INTRAVENOUS | Status: DC

## 2020-06-14 MED ORDER — FENTANYL CITRATE (PF) 250 MCG/5ML IJ SOLN
INTRAMUSCULAR | Status: DC | PRN
  Administered 2020-06-14 (×4): 50 ug via INTRAVENOUS

## 2020-06-14 MED ORDER — METHOCARBAMOL 500 MG IVPB - SIMPLE MED
INTRAVENOUS | Status: AC
  Filled 2020-06-14: qty 50

## 2020-06-14 MED ORDER — SODIUM CHLORIDE 0.9 % IV SOLN: INTRAVENOUS | Status: DC

## 2020-06-14 MED ORDER — ACETAMINOPHEN 500 MG PO TABS: 1000.0000 mg | ORAL_TABLET | Freq: Once | ORAL | Status: DC

## 2020-06-14 MED ORDER — DIPHENHYDRAMINE HCL 12.5 MG/5ML PO ELIX: 12.5000 mg | ORAL_SOLUTION | ORAL | Status: DC | PRN

## 2020-06-14 MED ORDER — METOCLOPRAMIDE HCL 5 MG/ML IJ SOLN: 5.0000 mg | Freq: Three times a day (TID) | INTRAMUSCULAR | Status: DC | PRN

## 2020-06-14 MED ORDER — RIVAROXABAN 10 MG PO TABS: 10.0000 mg | ORAL_TABLET | Freq: Every day | ORAL | Status: DC

## 2020-06-14 MED ORDER — GLYCOPYRROLATE PF 0.2 MG/ML IJ SOSY
PREFILLED_SYRINGE | INTRAMUSCULAR | Status: AC
  Filled 2020-06-14: qty 1

## 2020-06-14 MED ORDER — POLYETHYLENE GLYCOL 3350 17 G PO PACK
17.0000 g | PACK | Freq: Every day | ORAL | Status: DC | PRN
  Administered 2020-06-17: 08:00:00 17 g via ORAL
  Filled 2020-06-14: qty 1

## 2020-06-14 MED ORDER — ACETAMINOPHEN 10 MG/ML IV SOLN
1000.0000 mg | Freq: Once | INTRAVENOUS | Status: AC
  Administered 2020-06-14: 1000 mg via INTRAVENOUS
  Filled 2020-06-14: qty 100

## 2020-06-14 MED ORDER — ORAL CARE MOUTH RINSE: 15.0000 mL | Freq: Once | OROMUCOSAL | Status: AC

## 2020-06-14 MED ORDER — FENTANYL CITRATE (PF) 100 MCG/2ML IJ SOLN
25.0000 ug | INTRAMUSCULAR | Status: DC | PRN
  Administered 2020-06-14: 50 ug via INTRAVENOUS

## 2020-06-14 MED ORDER — ALUM & MAG HYDROXIDE-SIMETH 200-200-20 MG/5ML PO SUSP: 30.0000 mL | ORAL | Status: DC | PRN

## 2020-06-14 MED ORDER — ACETAMINOPHEN 325 MG PO TABS: 325.0000 mg | ORAL_TABLET | Freq: Four times a day (QID) | ORAL | Status: DC | PRN

## 2020-06-14 MED ORDER — FLUTICASONE PROPIONATE 50 MCG/ACT NA SUSP
1.0000 | Freq: Every day | NASAL | Status: DC | PRN
  Filled 2020-06-14: qty 16

## 2020-06-14 MED ORDER — NAPHAZOLINE-GLYCERIN 0.012-0.2 % OP SOLN
1.0000 [drp] | Freq: Four times a day (QID) | OPHTHALMIC | Status: DC | PRN
  Filled 2020-06-14: qty 15

## 2020-06-14 MED ORDER — DEXAMETHASONE SODIUM PHOSPHATE 10 MG/ML IJ SOLN
INTRAMUSCULAR | Status: DC | PRN
  Administered 2020-06-14: 10 mg via INTRAVENOUS

## 2020-06-14 MED ORDER — DEXAMETHASONE SODIUM PHOSPHATE 10 MG/ML IJ SOLN
INTRAMUSCULAR | Status: AC
  Filled 2020-06-14: qty 1

## 2020-06-14 MED ORDER — FENTANYL CITRATE (PF) 100 MCG/2ML IJ SOLN
INTRAMUSCULAR | Status: AC
  Administered 2020-06-14: 50 ug via INTRAVENOUS
  Filled 2020-06-14: qty 2

## 2020-06-14 MED ORDER — DOCUSATE SODIUM 100 MG PO CAPS
100.0000 mg | ORAL_CAPSULE | Freq: Two times a day (BID) | ORAL | Status: DC
  Administered 2020-06-14 – 2020-06-17 (×6): 100 mg via ORAL
  Filled 2020-06-14 (×6): qty 1

## 2020-06-14 MED ORDER — METOCLOPRAMIDE HCL 5 MG PO TABS: 5.0000 mg | ORAL_TABLET | Freq: Three times a day (TID) | ORAL | Status: DC | PRN

## 2020-06-14 MED ORDER — MORPHINE SULFATE (PF) 2 MG/ML IV SOLN: 0.5000 mg | INTRAVENOUS | Status: DC | PRN

## 2020-06-14 MED ORDER — ESCITALOPRAM OXALATE 10 MG PO TABS
10.0000 mg | ORAL_TABLET | Freq: Every evening | ORAL | Status: DC
  Administered 2020-06-14 – 2020-06-16 (×3): 10 mg via ORAL
  Filled 2020-06-14 (×3): qty 1

## 2020-06-14 MED ORDER — CEFAZOLIN SODIUM-DEXTROSE 2-4 GM/100ML-% IV SOLN
INTRAVENOUS | Status: AC
  Filled 2020-06-14: qty 100

## 2020-06-14 SURGICAL SUPPLY — 63 items
BAG DECANTER FOR FLEXI CONT (MISCELLANEOUS) IMPLANT
BAG ZIPLOCK 12X15 (MISCELLANEOUS) IMPLANT
BLADE SURG SZ10 CARB STEEL (BLADE) IMPLANT
CHLORAPREP W/TINT 26 (MISCELLANEOUS) ×3 IMPLANT
COVER PERINEAL POST (MISCELLANEOUS) ×3 IMPLANT
COVER SURGICAL LIGHT HANDLE (MISCELLANEOUS) ×3 IMPLANT
COVER WAND RF STERILE (DRAPES) IMPLANT
DECANTER SPIKE VIAL GLASS SM (MISCELLANEOUS) ×3 IMPLANT
DERMABOND ADVANCED (GAUZE/BANDAGES/DRESSINGS) ×4
DERMABOND ADVANCED .7 DNX12 (GAUZE/BANDAGES/DRESSINGS) ×2 IMPLANT
DRAPE IMP U-DRAPE 54X76 (DRAPES) ×3 IMPLANT
DRAPE SHEET LG 3/4 BI-LAMINATE (DRAPES) ×9 IMPLANT
DRAPE STERI IOBAN 125X83 (DRAPES) IMPLANT
DRAPE U-SHAPE 47X51 STRL (DRAPES) ×6 IMPLANT
DRSG AQUACEL AG ADV 3.5X10 (GAUZE/BANDAGES/DRESSINGS) ×3 IMPLANT
ELECT REM PT RETURN 15FT ADLT (MISCELLANEOUS) ×3 IMPLANT
GAUZE SPONGE 4X4 12PLY STRL (GAUZE/BANDAGES/DRESSINGS) ×3 IMPLANT
GLOVE BIO SURGEON STRL SZ8.5 (GLOVE) ×6 IMPLANT
GLOVE BIOGEL M STRL SZ7.5 (GLOVE) ×6 IMPLANT
GLOVE BIOGEL PI IND STRL 8 (GLOVE) ×1 IMPLANT
GLOVE BIOGEL PI IND STRL 8.5 (GLOVE) ×1 IMPLANT
GLOVE BIOGEL PI INDICATOR 8 (GLOVE) ×2
GLOVE BIOGEL PI INDICATOR 8.5 (GLOVE) ×2
GOWN SPEC L3 XXLG W/TWL (GOWN DISPOSABLE) ×3 IMPLANT
GOWN STRL REUS W/ TWL LRG LVL3 (GOWN DISPOSABLE) ×1 IMPLANT
GOWN STRL REUS W/TWL LRG LVL3 (GOWN DISPOSABLE) ×2
HANDPIECE INTERPULSE COAX TIP (DISPOSABLE) ×2
HEAD FEMORAL 32 CERAMIC (Hips) ×3 IMPLANT
HOLDER FOLEY CATH W/STRAP (MISCELLANEOUS) ×3 IMPLANT
HOOD PEEL AWAY FLYTE STAYCOOL (MISCELLANEOUS) ×12 IMPLANT
JET LAVAGE IRRISEPT WOUND (IRRIGATION / IRRIGATOR) ×3
KIT TURNOVER KIT A (KITS) IMPLANT
LAVAGE JET IRRISEPT WOUND (IRRIGATION / IRRIGATOR) ×1 IMPLANT
LINER PINN ACET NEUT 32X52 ×3 IMPLANT
MANIFOLD NEPTUNE II (INSTRUMENTS) ×3 IMPLANT
MARKER SKIN DUAL TIP RULER LAB (MISCELLANEOUS) ×3 IMPLANT
NDL SAFETY ECLIPSE 18X1.5 (NEEDLE) ×1 IMPLANT
NEEDLE HYPO 18GX1.5 SHARP (NEEDLE) ×2
NEEDLE SPNL 18GX3.5 QUINCKE PK (NEEDLE) ×3 IMPLANT
PACK ANTERIOR HIP CUSTOM (KITS) ×3 IMPLANT
PENCIL SMOKE EVACUATOR (MISCELLANEOUS) IMPLANT
PIN SECTOR W/GRIP ACE CUP 52MM (Hips) ×3 IMPLANT
SAW OSC TIP CART 19.5X105X1.3 (SAW) ×3 IMPLANT
SCREW 6.5MMX30MM (Screw) ×3 IMPLANT
SCREW 6.5MMX35MM (Screw) ×3 IMPLANT
SEALER BIPOLAR AQUA 6.0 (INSTRUMENTS) ×3 IMPLANT
SET HNDPC FAN SPRY TIP SCT (DISPOSABLE) ×1 IMPLANT
STAPLER VISISTAT 35W (STAPLE) ×3 IMPLANT
STEM TRI LOC BPS SZ6 W GRIPTON ×1 IMPLANT
SUT ETHIBOND NAB CT1 #1 30IN (SUTURE) ×6 IMPLANT
SUT MNCRL AB 3-0 PS2 18 (SUTURE) ×3 IMPLANT
SUT MNCRL AB 4-0 PS2 18 (SUTURE) ×3 IMPLANT
SUT MON AB 2-0 CT1 36 (SUTURE) ×6 IMPLANT
SUT STRATAFIX PDO 1 14 VIOLET (SUTURE) ×2
SUT STRATFX PDO 1 14 VIOLET (SUTURE) ×1
SUT VIC AB 2-0 CT1 27 (SUTURE) ×2
SUT VIC AB 2-0 CT1 TAPERPNT 27 (SUTURE) ×1 IMPLANT
SUTURE STRATFX PDO 1 14 VIOLET (SUTURE) ×1 IMPLANT
SYR 3ML LL SCALE MARK (SYRINGE) ×3 IMPLANT
TRAY FOLEY MTR SLVR 16FR STAT (SET/KITS/TRAYS/PACK) IMPLANT
TRI LOC BPS SZ 6 W GRIPTON ×3 IMPLANT
TUBE SUCTION HIGH CAP CLEAR NV (SUCTIONS) ×3 IMPLANT
WATER STERILE IRR 1000ML POUR (IV SOLUTION) ×3 IMPLANT

## 2020-06-14 NOTE — Op Note (Signed)
OPERATIVE REPORT   SURGEON: Samson Frederic, MD   ASSISTANT: Barrie Dunker, PA-C.  PREOPERATIVE DIAGNOSIS: Right hip arthritis.   POSTOPERATIVE DIAGNOSIS: Right hip arthritis.   PROCEDURE: Right total hip arthroplasty, anterior approach.   IMPLANTS: DePuy Tri Lock stem, size 6, hi offset. DePuy Pinnacle Cup, size 52 mm. DePuy Altrx liner, size 32 by 52 mm, neutral. DePuy Biolox ceramic head ball, size 32 + 1 mm. 6.5 mm cancellous bone screws x2.  ANESTHESIA:  General  ESTIMATED BLOOD LOSS:-500 mL    ANTIBIOTICS: 1 g vancomycin (PCN allergy). 900 mg clindamycin.  DRAINS: None.  COMPLICATIONS: None.   CONDITION: PACU - hemodynamically stable.   BRIEF CLINICAL NOTE: Melissa Price is a 69 y.o. female with a long-standing history of Right hip arthritis. After failing conservative management, the patient was indicated for total hip arthroplasty. The risks, benefits, and alternatives to the procedure were explained, and the patient elected to proceed.  PROCEDURE IN DETAIL: Surgical site was marked by myself in the pre-op holding area. Once inside the operating room, spinal anesthesia was obtained, and a foley catheter was inserted. The patient was then positioned on the Hana table.  All bony prominences were well padded.  The hip was prepped and draped in the normal sterile surgical fashion.  A time-out was called verifying side and site of surgery. The patient received IV antibiotics within 60 minutes of beginning the procedure.   The direct anterior approach to the hip was performed through the Hueter interval.  Lateral femoral circumflex vessels were treated with the Auqumantys. The anterior capsule was exposed and an inverted T capsulotomy was made. The femoral neck cut was made to the level of the templated cut.  A corkscrew was placed into the head and the head was removed.  The femoral head was found to have eburnated bone. The head was passed to the back table and was  measured.   Acetabular exposure was achieved, and the pulvinar and labrum were excised. Sequential reaming of the acetabulum was then performed up to a size 51 mm reamer. A 52 mm cup was then opened and impacted into place at approximately 40 degrees of abduction and 20 degrees of anteversion. I elected to augment the acceptable press fit fixation with 6.5 mm cancellous bone screws x2.The final polyethylene liner was impacted into place and acetabular osteophytes were removed.    I then gained femoral exposure taking care to protect the abductors and greater trochanter.  This was performed using standard external rotation, extension, and adduction.  The capsule was peeled off the inner aspect of the greater trochanter, taking care to preserve the short external rotators. A cookie cutter was used to enter the femoral canal, and then the femoral canal finder was placed.  Sequential broaching was performed up to a size 6.  Calcar planer was used on the femoral neck remnant.  I placed a hi offset neck and a trial head ball.  The hip was reduced.  Leg lengths and offset were checked fluoroscopically.  The hip was dislocated and trial components were removed.  The final implants were placed, and the hip was reduced.  Fluoroscopy was used to confirm component position and leg lengths.  At 90 degrees of external rotation and full extension, the hip was stable to an anterior directed force.   The wound was copiously irrigated with Irrisept solution and normal saline using pule lavage.  Marcaine solution was injected into the periarticular soft tissue.  The wound was closed in  layers using #1 Stratafix for the fascia, 2-0 Vicryl for the subcutaneous fat, 2-0 Monocryl for the deep dermal layer,and staples + Dermabond for the skin.  Once the glue was fully dried, an Aquacell Ag dressing was applied.  The patient was transported to the recovery room in stable condition.  Sponge, needle, and instrument counts were correct  at the end of the case x2.  The patient tolerated the procedure well and there were no known complications.  Please note that a surgical assistant was a medical necessity for this procedure to perform it in a safe and expeditious manner. Assistant was necessary to provide appropriate retraction of vital neurovascular structures, to prevent femoral fracture, and to allow for anatomic placement of the prosthesis.

## 2020-06-14 NOTE — Anesthesia Procedure Notes (Signed)
Procedure Name: Intubation Date/Time: 06/14/2020 8:39 AM Performed by: Shary Decamp, CRNA Pre-anesthesia Checklist: Patient identified, Patient being monitored, Timeout performed, Emergency Drugs available and Suction available Patient Re-evaluated:Patient Re-evaluated prior to induction Oxygen Delivery Method: Circle System Utilized Preoxygenation: Pre-oxygenation with 100% oxygen Induction Type: IV induction Ventilation: Mask ventilation without difficulty Laryngoscope Size: Miller and 2 Grade View: Grade I Tube type: Oral Tube size: 7.0 mm Number of attempts: 1 Airway Equipment and Method: Stylet Placement Confirmation: ETT inserted through vocal cords under direct vision,  positive ETCO2 and breath sounds checked- equal and bilateral Secured at: 21 cm Tube secured with: Tape Dental Injury: Teeth and Oropharynx as per pre-operative assessment

## 2020-06-14 NOTE — Transfer of Care (Signed)
Immediate Anesthesia Transfer of Care Note  Patient: Melissa Price  Procedure(s) Performed: TOTAL HIP ARTHROPLASTY ANTERIOR APPROACH (Right Hip)  Patient Location: PACU  Anesthesia Type:General  Level of Consciousness: awake, alert , oriented and patient cooperative  Airway & Oxygen Therapy: Patient Spontanous Breathing and Patient connected to face mask oxygen  Post-op Assessment: Report given to RN, Post -op Vital signs reviewed and stable and Patient moving all extremities  Post vital signs: Reviewed and stable  Last Vitals:  Vitals Value Taken Time  BP 106/59 06/14/20 1107  Temp    Pulse 68 06/14/20 1109  Resp 13 06/14/20 1109  SpO2 100 % 06/14/20 1109  Vitals shown include unvalidated device data.  Last Pain:  Vitals:   06/14/20 0705  TempSrc: Oral  PainSc:       Patients Stated Pain Goal: 4 (06/14/20 7124)  Complications: No complications documented.

## 2020-06-14 NOTE — Anesthesia Postprocedure Evaluation (Signed)
Anesthesia Post Note  Patient: Melissa Price  Procedure(s) Performed: TOTAL HIP ARTHROPLASTY ANTERIOR APPROACH (Right Hip)     Patient location during evaluation: PACU Anesthesia Type: General Level of consciousness: awake Pain management: pain level controlled Vital Signs Assessment: post-procedure vital signs reviewed and stable Respiratory status: spontaneous breathing, nonlabored ventilation, respiratory function stable and patient connected to nasal cannula oxygen Cardiovascular status: blood pressure returned to baseline and stable Postop Assessment: no apparent nausea or vomiting Anesthetic complications: no   No complications documented.  Last Vitals:  Vitals:   06/14/20 1506 06/14/20 1605  BP: (!) 101/48 (!) 91/58  Pulse: (!) 53 (!) 58  Resp: 18 18  Temp: 36.4 C (!) 36.4 C  SpO2: 100% 100%    Last Pain:  Vitals:   06/14/20 1628  TempSrc:   PainSc: 0-No pain                 Onur Mori P Sady Monaco

## 2020-06-14 NOTE — Interval H&P Note (Signed)
History and Physical Interval Note:  06/14/2020 8:00 AM  Clearnce Hasten  has presented today for surgery, with the diagnosis of degenerative joint disease right hip.  The various methods of treatment have been discussed with the patient and family. After consideration of risks, benefits and other options for treatment, the patient has consented to  Procedure(s): TOTAL HIP ARTHROPLASTY ANTERIOR APPROACH (Right) as a surgical intervention.  The patient's history has been reviewed, patient examined, no change in status, stable for surgery.  I have reviewed the patient's chart and labs.  Questions were answered to the patient's satisfaction.    The risks, benefits, and alternatives were discussed with the patient. There are risks associated with the surgery including, but not limited to, problems with anesthesia (death), infection, instability (giving out of the joint), dislocation, differences in leg length/angulation/rotation, fracture of bones, loosening or failure of implants, hematoma (blood accumulation) which may require surgical drainage, blood clots, pulmonary embolism, nerve injury (foot drop and lateral thigh numbness), and blood vessel injury. The patient understands these risks and elects to proceed.    Iline Oven Sereniti Wan

## 2020-06-14 NOTE — Evaluation (Signed)
Physical Therapy Evaluation Patient Details Name: Melissa Price MRN: [id number] DOB: 12-Nov-1950 Today's Date: 06/14/2020   History of Present Illness  Patient is 69 y.o. female s/p Rt THA anterior appraoch on 06/14/20 with PMH significant for HTN, osteopenia, GERD, OA, A-fib.    Clinical Impression  Melissa Price is a 69 y.o. female POD 0 s/p Rt THA. Patient reports independence with mobility at baseline. Patient is now limited by functional impairments (see PT problem list below) and requires min assist for transfers and gait with RW. Patient was able to ambulate ~25 feet with RW and min assist. She was able to void bladder on BSC and required min assist to rise from commode. Patient instructed in exercise to facilitate ROM and circulation. Patient will benefit from continued skilled PT interventions to address impairments and progress towards PLOF. Acute PT will follow to progress mobility and stair training in preparation for safe discharge home.     Follow Up Recommendations Follow surgeon's recommendation for DC plan and follow-up therapies    Equipment Recommendations  Rolling walker with 5" wheels;3in1 (PT)    Recommendations for Other Services       Precautions / Restrictions Precautions Precautions: Fall Restrictions Weight Bearing Restrictions: No Other Position/Activity Restrictions: WBAT      Mobility  Bed Mobility Overal bed mobility: Needs Assistance Bed Mobility: Supine to Sit     Supine to sit: Min assist;HOB elevated     General bed mobility comments: assist for Rt LE to mobilize to EOB, pt using bed rail to pivot and sit up EOB.    Transfers Overall transfer level: Needs assistance Equipment used: Rolling walker (2 wheeled) Transfers: Sit to/from Stand Sit to Stand: Min assist         General transfer comment: cues for hand placement on RW. min assist for power up from EOB and BSC.   Ambulation/Gait Ambulation/Gait assistance: Min assist Gait  Distance (Feet): 25 Feet (in room to/from bathroom) Assistive device: Rolling walker (2 wheeled) Gait Pattern/deviations: Step-to pattern;Decreased stride length Gait velocity: decr   General Gait Details: VC's for step pattern and proximity to RW, no overt LOB. pt reports slight dizziness after walking, BP stable.  Stairs            Wheelchair Mobility    Modified Rankin (Stroke Patients Only)       Balance Overall balance assessment: Needs assistance Sitting-balance support: Feet supported Sitting balance-Leahy Scale: Good     Standing balance support: During functional activity;Bilateral upper extremity supported Standing balance-Leahy Scale: Fair                               Pertinent Vitals/Pain Pain Assessment: 0-10 Pain Score: 2  Pain Location: Rt hip Pain Descriptors / Indicators: Aching Pain Intervention(s): Limited activity within patient's tolerance;Monitored during session;Repositioned;Ice applied    Home Living Family/patient expects to be discharged to:: Private residence Living Arrangements: Spouse/significant other Available Help at Discharge: Family Type of Home: House Home Access: Stairs to enter Entrance Stairs-Rails: None Entrance Stairs-Number of Steps: 2 Home Layout: Two level;Bed/bath upstairs;Full bath on main level Home Equipment: Cane - single point;Shower seat;Grab bars - tub/shower      Prior Function Level of Independence: Independent;Independent with assistive device(s)         Comments: pt has been using SPC since July/August     Hand Dominance   Dominant Hand: Right    Extremity/Trunk Assessment   Upper  Extremity Assessment Upper Extremity Assessment: Overall WFL for tasks assessed    Lower Extremity Assessment Lower Extremity Assessment: Overall WFL for tasks assessed    Cervical / Trunk Assessment Cervical / Trunk Assessment: Normal  Communication   Communication: No difficulties  Cognition  Arousal/Alertness: Awake/alert Behavior During Therapy: WFL for tasks assessed/performed Overall Cognitive Status: Within Functional Limits for tasks assessed                                        General Comments      Exercises Total Joint Exercises Ankle Circles/Pumps: AROM;Both;Seated;20 reps Quad Sets: AROM;Right;10 reps;Seated Heel Slides: AROM;Right;10 reps;Seated   Assessment/Plan    PT Assessment Patient needs continued PT services  PT Problem List Decreased strength;Decreased range of motion;Decreased activity tolerance;Decreased balance;Decreased mobility;Decreased knowledge of use of DME;Decreased knowledge of precautions       PT Treatment Interventions Gait training;DME instruction;Stair training;Functional mobility training;Therapeutic activities;Balance training;Therapeutic exercise;Patient/family education    PT Goals (Current goals can be found in the Care Plan section)  Acute Rehab PT Goals Patient Stated Goal: recover independence PT Goal Formulation: With patient Time For Goal Achievement: 06/21/20 Potential to Achieve Goals: Good    Frequency 7X/week   Barriers to discharge        Co-evaluation               AM-PAC PT "6 Clicks" Mobility  Outcome Measure Help needed turning from your back to your side while in a flat bed without using bedrails?: A Little Help needed moving from lying on your back to sitting on the side of a flat bed without using bedrails?: A Little Help needed moving to and from a bed to a chair (including a wheelchair)?: A Little Help needed standing up from a chair using your arms (e.g., wheelchair or bedside chair)?: A Little Help needed to walk in hospital room?: A Little Help needed climbing 3-5 steps with a railing? : A Lot 6 Click Score: 17    End of Session Equipment Utilized During Treatment: Gait belt Activity Tolerance: Patient tolerated treatment well Patient left: in chair;with call  bell/phone within reach;with chair alarm set;with family/visitor present Nurse Communication: Mobility status PT Visit Diagnosis: Muscle weakness (generalized) (M62.81);Difficulty in walking, not elsewhere classified (R26.2)    Time: 8250-5397 PT Time Calculation (min) (ACUTE ONLY): 30 min   Charges:   PT Evaluation $PT Eval Low Complexity: 1 Low PT Treatments $Gait Training: 8-22 mins        Wynn Maudlin, DPT Acute Rehabilitation Services  Office [phone] Pager [phone]  06/14/2020 4:55 PM

## 2020-06-14 NOTE — Progress Notes (Signed)
Orthopedic Tech Progress Note Patient Details:  Melissa Price Mar 14, 1951 [id number]  Ortho Devices Ortho Device/Splint Location: trapeze bar Ortho Device/Splint Interventions: Application   Post Interventions Patient Tolerated: Well Instructions Provided: Adjustment of device, Care of device   Saul Fordyce 06/14/2020, 4:58 PM

## 2020-06-15 ENCOUNTER — Encounter (HOSPITAL_COMMUNITY): Payer: Self-pay | Admitting: Orthopedic Surgery

## 2020-06-15 DIAGNOSIS — D649 Anemia, unspecified: Secondary | ICD-10-CM | POA: Diagnosis not present

## 2020-06-15 DIAGNOSIS — I498 Other specified cardiac arrhythmias: Secondary | ICD-10-CM | POA: Diagnosis not present

## 2020-06-15 DIAGNOSIS — I959 Hypotension, unspecified: Secondary | ICD-10-CM

## 2020-06-15 DIAGNOSIS — M1611 Unilateral primary osteoarthritis, right hip: Secondary | ICD-10-CM | POA: Diagnosis not present

## 2020-06-15 LAB — CBC
HCT: 22.1 % — ABNORMAL LOW (ref 36.0–46.0)
Hemoglobin: 7 g/dL — ABNORMAL LOW (ref 12.0–15.0)
MCH: 32.7 pg (ref 26.0–34.0)
MCHC: 31.7 g/dL (ref 30.0–36.0)
MCV: 103.3 fL — ABNORMAL HIGH (ref 80.0–100.0)
Platelets: 150 10*3/uL (ref 150–400)
RBC: 2.14 MIL/uL — ABNORMAL LOW (ref 3.87–5.11)
RDW: 12.9 % (ref 11.5–15.5)
WBC: 9.4 10*3/uL (ref 4.0–10.5)
nRBC: 0 % (ref 0.0–0.2)

## 2020-06-15 LAB — BASIC METABOLIC PANEL
Anion gap: 9 (ref 5–15)
BUN: 19 mg/dL (ref 8–23)
CO2: 22 mmol/L (ref 22–32)
Calcium: 8.6 mg/dL — ABNORMAL LOW (ref 8.9–10.3)
Chloride: 103 mmol/L (ref 98–111)
Creatinine, Ser: 0.84 mg/dL (ref 0.44–1.00)
GFR, Estimated: >60 mL/min (ref >60)
Glucose, Bld: 218 mg/dL — ABNORMAL HIGH (ref 70–99)
Potassium: 4.4 mmol/L (ref 3.5–5.1)
Sodium: 134 mmol/L — ABNORMAL LOW (ref 135–145)

## 2020-06-15 LAB — HEMOGLOBIN AND HEMATOCRIT, BLOOD
HCT: 22.7 % — ABNORMAL LOW (ref 36.0–46.0)
HCT: 25.4 % — ABNORMAL LOW (ref 36.0–46.0)
Hemoglobin: 7.3 g/dL — ABNORMAL LOW (ref 12.0–15.0)
Hemoglobin: 8.3 g/dL — ABNORMAL LOW (ref 12.0–15.0)

## 2020-06-15 LAB — PREPARE RBC (CROSSMATCH)

## 2020-06-15 LAB — MAGNESIUM: Magnesium: 1.4 mg/dL — ABNORMAL LOW (ref 1.7–2.4)

## 2020-06-15 MED ORDER — SODIUM CHLORIDE 0.9% IV SOLUTION: Freq: Once | INTRAVENOUS | Status: DC

## 2020-06-15 MED ORDER — MAGNESIUM SULFATE 4 GM/100ML IV SOLN
4.0000 g | Freq: Once | INTRAVENOUS | Status: AC
  Administered 2020-06-15: 4 g via INTRAVENOUS
  Filled 2020-06-15 (×2): qty 100

## 2020-06-15 NOTE — Progress Notes (Signed)
Pt up to [facility] and while sitting BSC pinched both inner upper thighs. Slight bleeding and bruising noted. bandaids applied

## 2020-06-15 NOTE — Progress Notes (Signed)
Pt sitting up in chair. Vital signs taken, heart rate 38, apical , bp, 80/44, pt asymptomatic, rapid response called, remain at bedside, 02 at 2l/[place] sating 100percent. 3552 Dr Dairl Ponder at bedside talking with patient

## 2020-06-15 NOTE — Progress Notes (Signed)
PT Cancellation Note  Patient Details Name: Melissa Price MRN: [id number] DOB: 1951-03-05   Cancelled Treatment:    Reason Eval/Treat Not Completed: Medical issues which prohibited therapy (HR in 30s per RN, rapid response called. Will hold and follow.)   Tamala Ser PT 06/15/2020  Acute Rehabilitation Services Pager [phone] Office [phone]

## 2020-06-15 NOTE — Consult Note (Addendum)
Medical Consultation   Melissa Price  LKG:[id number]  DOB: May 18, 1951  DOA: 06/14/2020  PCP: Janean Sark, NP    Requesting physician: Dr. Linna Caprice  Reason for consultation: Ventricular Bigeminy   History of Present Illness: This is a 69 y.o. female with a past medical history of atrial fibrillation on Xarelto, GI bleed, hypertension, osteoarthritis who is currently admitted for right hip arthritis who underwent elective right hip total hip arthroplasty on 06/14/2020 with Dr. Linna Caprice.  Today, 06/15/2020, the patient was sitting upright in her chair and upon routine vitals her HR was noted to be 38 with BP 80/44.  She was asymptomatic.  A rapid response was called which also prompted a hospitalist consult.  Currently, the patient is resting comfortably in bed in no acute distress.  States that she last took her Xarelto on Sunday, 12/5.  Per bedside nurse, patient received her Lopressor yesterday evening and did not receive any antihypertensives, Xarelto or beta-blockers this a.m.  Patient denies any chest pain, palpitations, nausea or vomiting.  No significantly increasing pain of her right hip or swelling.  Denies any GI bleeding.   Review of Systems:  Review of Systems  All other systems reviewed and are negative.  As per HPI otherwise 10 point review of systems negative.     Past Medical History: Past Medical History:  Diagnosis Date  . Arthritis   . Atrial fibrillation (HCC)   . Complication of anesthesia   . Dysrhythmia    afib   . GERD (gastroesophageal reflux disease)   . History of osteopenia    both hips  . Hypertension   . PONV (postoperative nausea and vomiting)     Past Surgical History: Past Surgical History:  Procedure Laterality Date  . KNEE SURGERY Right 2000   Dr. Ronney Asters - Arthroscopic     Allergies:   Allergies  Allergen Reactions  . Penicillins Shortness Of Breath    amoxicillin     Social History:  reports  that she has never smoked. She has never used smokeless tobacco. She reports current alcohol use. She reports that she does not use drugs.   Family History: Family History  Problem Relation Age of Onset  . Heart disease Mother   . Heart disease Father      Physical Exam: Vitals:   06/15/20 0930 06/15/20 0940 06/15/20 1000 06/15/20 1013  BP: 97/82 (!) 97/48 (!) 99/50 (!) 95/46  Pulse: 70 92 84 95  Resp: 18   18  Temp:      TempSrc:      SpO2: 100% 98% 100% 100%  Weight:      Height:        Physical Exam Vitals and nursing note reviewed.  Constitutional:      Appearance: Normal appearance.  HENT:     Head: Normocephalic and atraumatic.  Eyes:     Conjunctiva/sclera: Conjunctivae normal.  Cardiovascular:     Rate and Rhythm: Rhythm irregular.     Heart sounds: No murmur heard.     Comments: Telemetry at bedside with HR 75 and ventricular bigeminy.  Every other beat is nonperfusing Pulmonary:     Effort: Pulmonary effort is normal.     Breath sounds: Normal breath sounds.  Abdominal:     General: Abdomen is flat.     Palpations: Abdomen is soft.  Musculoskeletal:        General: No swelling or  tenderness.     Comments: Right hip with postop changes and without any significant swelling, ecchymosis or tenderness  Skin:    Coloration: Skin is pale. Skin is not jaundiced.  Neurological:     Mental Status: She is alert. Mental status is at baseline.  Psychiatric:        Mood and Affect: Mood normal.        Behavior: Behavior normal.       Data reviewed:  I have personally reviewed following labs and imaging studies Labs:  CBC: Recent Labs  Lab 06/15/20 0338  WBC 9.4  HGB 7.0*  HCT 22.1*  MCV 103.3*  PLT 150    Basic Metabolic Panel: Recent Labs  Lab 06/15/20 0338  NA 134*  K 4.4  CL 103  CO2 22  GLUCOSE 218*  BUN 19  CREATININE 0.84  CALCIUM 8.6*  MG 1.4*   GFR Estimated Creatinine Clearance: 50.8 mL/min (by C-G formula based on SCr of 0.84  mg/dL). Liver Function Tests: No results for input(s): AST, ALT, ALKPHOS, BILITOT, PROT, ALBUMIN in the last 168 hours. No results for input(s): LIPASE, AMYLASE in the last 168 hours. No results for input(s): AMMONIA in the last 168 hours. Coagulation profile No results for input(s): INR, PROTIME in the last 168 hours.  Cardiac Enzymes: No results for input(s): CKTOTAL, CKMB, CKMBINDEX, TROPONINI in the last 168 hours. BNP: Invalid input(s): POCBNP CBG: No results for input(s): GLUCAP in the last 168 hours. D-Dimer No results for input(s): DDIMER in the last 72 hours. Hgb A1c No results for input(s): HGBA1C in the last 72 hours. Lipid Profile No results for input(s): CHOL, HDL, LDLCALC, TRIG, CHOLHDL, LDLDIRECT in the last 72 hours. Thyroid function studies No results for input(s): TSH, T4TOTAL, T3FREE, THYROIDAB in the last 72 hours.  Invalid input(s): FREET3 Anemia work up No results for input(s): VITAMINB12, FOLATE, FERRITIN, TIBC, IRON, RETICCTPCT in the last 72 hours. Urinalysis    Component Value Date/Time   COLORURINE AMBER (A) 06/06/2020 1412   APPEARANCEUR CLEAR 06/06/2020 1412   LABSPEC 1.014 06/06/2020 1412   PHURINE 5.0 06/06/2020 1412   GLUCOSEU NEGATIVE 06/06/2020 1412   HGBUR NEGATIVE 06/06/2020 1412   BILIRUBINUR NEGATIVE 06/06/2020 1412   KETONESUR NEGATIVE 06/06/2020 1412   PROTEINUR NEGATIVE 06/06/2020 1412   NITRITE NEGATIVE 06/06/2020 1412   LEUKOCYTESUR NEGATIVE 06/06/2020 1412     Microbiology Recent Results (from the past 240 hour(s))  Surgical pcr screen     Status: None   Collection Time: 06/06/20  2:12 PM   Specimen: Nasal Mucosa; Nasal Swab  Result Value Ref Range Status   MRSA, PCR NEGATIVE NEGATIVE Final   Staphylococcus aureus NEGATIVE NEGATIVE Final    Comment: (NOTE) The Xpert SA Assay (FDA approved for NASAL specimens in patients 77 years of age and older), is one component of a comprehensive surveillance program. It is not  intended to diagnose infection nor to guide or monitor treatment. Performed at [facility], [address], [place], [place] [address]   SARS CORONAVIRUS 2 (TAT 6-24 HRS) Nasopharyngeal Nasopharyngeal Swab     Status: None   Collection Time: 06/10/20 12:19 PM   Specimen: Nasopharyngeal Swab  Result Value Ref Range Status   SARS Coronavirus 2 NEGATIVE NEGATIVE Final    Comment: (NOTE) SARS-CoV-2 target nucleic acids are NOT DETECTED.  The SARS-CoV-2 RNA is generally detectable in upper and lower respiratory specimens during the acute phase of infection. Negative results do not preclude SARS-CoV-2  infection, do not rule out co-infections with other pathogens, and should not be used as the sole basis for treatment or other patient management decisions. Negative results must be combined with clinical observations, patient history, and epidemiological information. The expected result is Negative.  Fact Sheet for Patients: HairSlick.nohttps://www.fda.gov/media/138098/download  Fact Sheet for Healthcare Providers: quierodirigir.comhttps://www.fda.gov/media/138095/download  This test is not yet approved or cleared by the [place] FDA and  has been authorized for detection and/or diagnosis of SARS-CoV-2 by FDA under an Emergency Use Authorization (EUA). This EUA will remain  in effect (meaning this test can be used) for the duration of the COVID-19 declaration under Se ction 564(b)(1) of the Act, 21 U.S.C. section 360bbb-3(b)(1), unless the authorization is terminated or revoked sooner.  Performed at [facility], [address] [place], [place] [address]        Inpatient Medications:   Scheduled Meds: . sodium chloride   Intravenous Once  . atorvastatin  40 mg Oral QHS  . dexamethasone (DECADRON) injection  10 mg Intravenous Once  . docusate sodium  100 mg Oral BID  . escitalopram  10 mg Oral QPM  . ketorolac  7.5 mg Intravenous Q6H  . senna  2 tablet Oral QHS    Continuous Infusions: . sodium chloride 150 mL/hr at 06/14/20 2120  . magnesium sulfate bolus IVPB    . methocarbamol (ROBAXIN) IV Stopped (06/14/20 1349)     Radiological Exams on Admission: DG Pelvis Portable  Result Date: 06/14/2020 CLINICAL DATA:  Follow-up right hip arthroplasty. EXAM: PORTABLE PELVIS 1-2 VIEWS COMPARISON:  Earlier same day. FINDINGS: Total hip arthroplasty on the right. Components appear well positioned. No radiographically detectable complication or unexpected finding. Chronic osteoarthritis of the left hip. IMPRESSION: Good appearance following total hip arthroplasty on the right. Electronically Signed   By: Paulina FusiMark  Shogry M.D.   On: 06/14/2020 11:50   DG C-Arm 1-60 Min-No Report  Result Date: 06/14/2020 Fluoroscopy was utilized by the requesting physician.  No radiographic interpretation.   DG HIP OPERATIVE UNILAT W OR W/O PELVIS RIGHT  Result Date: 06/14/2020 CLINICAL DATA:  Hip replacement EXAM: OPERATIVE RIGHT HIP (WITH PELVIS IF PERFORMED) 2 VIEWS TECHNIQUE: Fluoroscopic spot image(s) were submitted for interpretation post-operatively. COMPARISON:  None. FINDINGS: Two intraoperative spot images demonstrate changes of right hip replacement. Normal AP alignment. No hardware complicating feature. IMPRESSION: Right hip replacement.  No visible complicating feature. Electronically Signed   By: Charlett NoseKevin  Dover M.D.   On: 06/14/2020 10:27    Impression/Recommendations Principal Problem:   Osteoarthritis of right hip Active Problems:   Primary osteoarthritis of right hip   Ventricular bigeminy   Hypotension   Acute anemia    1. Asymptomatic ventricular bigeminy with hypotension  a. Confirmed on EKG at bedside b. Hypotension improving with IV fluids (started yesterday around 1 pm) - continue for now and stop when she gets her blood transfusion to avoid volume overload.  Will decrease the rate from 150->100 mL/h c. Every other beat is nonperfusing therefore her  HR at bedside is likely bradycardic d. Correct anemia e. Correct electrolytes f. Telemetry g. Hold beta-blocker and antihypertensives for now  2. Acute anemia, likely postop a. Hb 12.2 on 11/30 and 7.0 at 3:30 AM.  No obvious signs of bleeding b. Repeat H&H now and trend every 6 hours c. Transfuse 1 unit PRBCs d. Hold Xarelto. Discussed with Dr. Linna CapriceSwinteck e. Consider scanning right hip  3. Asymptomatic bradycardia a. Hold beta-blockers b. If no improvement despite  above interventions then would consider cardiology consult  4. Osteoarthritis of the right hip s/p total hip arthroplasty on 12/8 with Dr. Linna Caprice, POD 1 a. Per primary team  5. Hypomagnesemia a. Replete IV  6. Hypertension a. Holding amlodipine and Lopressor due to hypotension currently  7. Hyperlipidemia a. On statin  8. Atrial fibrillation a. Plan as above  Thank you for this consultation.  Our [facility] hospitalist team will follow the patient with you.   Time Spent: 80 minutes  Jae Dire D.O.  [facility] 06/15/2020, 10:26 AM

## 2020-06-15 NOTE — Progress Notes (Signed)
    Subjective:  Patient reports pain as mild to moderate.  Denies N/V/CP/SOB. No c/o. Had asymptomatic brady this am, rapid response called hospitalist who rec transfer to tele and ordered one unit PRBCs.  Objective:   VITALS:   Vitals:   06/15/20 0930 06/15/20 0940 06/15/20 1000 06/15/20 1013  BP: 97/82 (!) 97/48 (!) 99/50 (!) 95/46  Pulse: 70 92 84 95  Resp: 18   18  Temp:      TempSrc:      SpO2: 100% 98% 100% 100%  Weight:      Height:        NAD ABD soft Sensation intact distally Intact pulses distally Dorsiflexion/Plantar flexion intact Incision: dressing C/D/I Compartment soft No hematoma  Lab Results  Component Value Date   WBC 9.4 06/15/2020   HGB 7.0 (L) 06/15/2020   HCT 22.1 (L) 06/15/2020   MCV 103.3 (H) 06/15/2020   PLT 150 06/15/2020   BMET    Component Value Date/Time   NA 134 (L) 06/15/2020 0338   K 4.4 06/15/2020 0338   CL 103 06/15/2020 0338   CO2 22 06/15/2020 0338   GLUCOSE 218 (H) 06/15/2020 0338   BUN 19 06/15/2020 0338   CREATININE 0.84 06/15/2020 0338   CALCIUM 8.6 (L) 06/15/2020 0338   GFRNONAA >60 06/15/2020 0338     Assessment/Plan: 1 Day Post-Op   Principal Problem:   Osteoarthritis of right hip Active Problems:   Primary osteoarthritis of right hip   Ventricular bigeminy   Hypotension   Acute anemia   WBAT with walker DVT ppx: Xarelto, SCDs, TEDS PO pain control PT/OT Medical management per hospitalist No evidence of bleeding / hematoma Dispo: d/c home with HEP when ok with hospitalist    Iline Oven Shauntel Prest 06/15/2020, 10:32 AM   Samson Frederic, MD [phone] [facility] now [facility]  [facility] [address], [address], [place], [place] [address] Phone: [phone] [email or web address] Facebook  [employer]

## 2020-06-15 NOTE — Progress Notes (Signed)
Orders recd to transfer pt to tele. Report phoned to [facility] , pt transported on bed with monitors on , sarah rn .

## 2020-06-15 NOTE — Plan of Care (Signed)

## 2020-06-15 NOTE — Progress Notes (Signed)
Physical Therapy Treatment Patient Details Name: Melissa Price MRN: [id number] DOB: 1951-03-22 Today's Date: 06/15/2020    History of Present Illness Patient is 69 y.o. female s/p Rt THA anterior appraoch on 06/14/20 with PMH significant for HTN, osteopenia, GERD, OA, A-fib.    PT Comments    Noted rapid response event this morning. Pt is soon to be getting blood transfusion. Performed THA exercises in bed with assistance. Will resume mobility tomorrow if pt is stable from a cardiac standpoint.   Follow Up Recommendations  Follow surgeon's recommendation for DC plan and follow-up therapies     Equipment Recommendations  Rolling walker with 5" wheels;3in1 (PT)    Recommendations for Other Services       Precautions / Restrictions Precautions Precautions: Fall Restrictions Weight Bearing Restrictions: No RLE Weight Bearing: Weight bearing as tolerated Other Position/Activity Restrictions: WBAT    Mobility  Bed Mobility               General bed mobility comments: NT- RN getting ready to start blood transfusion  Transfers                    Ambulation/Gait                 Stairs             Wheelchair Mobility    Modified Rankin (Stroke Patients Only)       Balance                                            Cognition Arousal/Alertness: Awake/alert Behavior During Therapy: WFL for tasks assessed/performed Overall Cognitive Status: Within Functional Limits for tasks assessed                                        Exercises Total Joint Exercises Ankle Circles/Pumps: AROM;Both;Seated;20 reps Quad Sets: AROM;Right;10 reps;Seated Short Arc Quad: AROM;Right;10 reps;Supine Heel Slides: AROM;Right;10 reps;Seated Hip ABduction/ADduction: AAROM;Right;10 reps;Supine    General Comments        Pertinent Vitals/Pain Pain Score: 2  Pain Location: Rt hip Pain Descriptors / Indicators: Aching Pain  Intervention(s): Limited activity within patient's tolerance;Monitored during session;Premedicated before session;Ice applied    Home Living                      Prior Function            PT Goals (current goals can now be found in the care plan section) Acute Rehab PT Goals Patient Stated Goal: recover independence PT Goal Formulation: With patient Time For Goal Achievement: 06/21/20 Potential to Achieve Goals: Good Progress towards PT goals: Progressing toward goals    Frequency    7X/week      PT Plan Current plan remains appropriate    Co-evaluation              AM-PAC PT "6 Clicks" Mobility   Outcome Measure  Help needed turning from your back to your side while in a flat bed without using bedrails?: A Little Help needed moving from lying on your back to sitting on the side of a flat bed without using bedrails?: A Little Help needed moving to and from a bed to a chair (including a wheelchair)?: A Little  Help needed standing up from a chair using your arms (e.g., wheelchair or bedside chair)?: A Little Help needed to walk in hospital room?: A Little Help needed climbing 3-5 steps with a railing? : A Lot 6 Click Score: 17    End of Session Equipment Utilized During Treatment: Gait belt Activity Tolerance: Patient tolerated treatment well Patient left: with call bell/phone within reach;with family/visitor present;in bed;with bed alarm set Nurse Communication: Mobility status PT Visit Diagnosis: Muscle weakness (generalized) (M62.81);Difficulty in walking, not elsewhere classified (R26.2)     Time: 1410-1437 PT Time Calculation (min) (ACUTE ONLY): 27 min  Charges:  $Therapeutic Exercise: 23-37 mins                     Ralene Bathe Kistler PT 06/15/2020  Acute Rehabilitation Services Pager [phone] Office [phone]

## 2020-06-15 NOTE — TOC Progression Note (Signed)
Transition of Care [facility]) - Progression Note    Patient Details  Name: Melissa Price MRN: [id number] Date of Birth: 21-Jun-1951  [facility]) CM/SW Contact  Clearance Coots, LCSW Phone Number: 06/15/2020, 11:17 AM  Clinical Narrative:   Patient admitted for right total hip arthroplasty.   Therapy Plan: D/C home w/ HEP [employer] company will deliver the patient RW and 3 in1 prior to discharge.     Barriers to Discharge: Continued Medical Work up  Expected Discharge Plan and Services                            Social Determinants of Health (SDOH) Interventions    Readmission Risk Interventions No flowsheet data found.

## 2020-06-16 ENCOUNTER — Inpatient Hospital Stay (HOSPITAL_COMMUNITY): Payer: Medicare PPO

## 2020-06-16 DIAGNOSIS — D649 Anemia, unspecified: Secondary | ICD-10-CM | POA: Diagnosis not present

## 2020-06-16 DIAGNOSIS — S72041A Displaced fracture of base of neck of right femur, initial encounter for closed fracture: Secondary | ICD-10-CM

## 2020-06-16 DIAGNOSIS — Z79899 Other long term (current) drug therapy: Secondary | ICD-10-CM | POA: Diagnosis not present

## 2020-06-16 DIAGNOSIS — I498 Other specified cardiac arrhythmias: Secondary | ICD-10-CM

## 2020-06-16 DIAGNOSIS — E1165 Type 2 diabetes mellitus with hyperglycemia: Secondary | ICD-10-CM | POA: Diagnosis present

## 2020-06-16 DIAGNOSIS — I495 Sick sinus syndrome: Secondary | ICD-10-CM

## 2020-06-16 DIAGNOSIS — I272 Pulmonary hypertension, unspecified: Secondary | ICD-10-CM | POA: Diagnosis not present

## 2020-06-16 DIAGNOSIS — I471 Supraventricular tachycardia: Secondary | ICD-10-CM | POA: Diagnosis not present

## 2020-06-16 DIAGNOSIS — R001 Bradycardia, unspecified: Secondary | ICD-10-CM | POA: Diagnosis not present

## 2020-06-16 DIAGNOSIS — K219 Gastro-esophageal reflux disease without esophagitis: Secondary | ICD-10-CM | POA: Diagnosis present

## 2020-06-16 DIAGNOSIS — E871 Hypo-osmolality and hyponatremia: Secondary | ICD-10-CM | POA: Diagnosis present

## 2020-06-16 DIAGNOSIS — E785 Hyperlipidemia, unspecified: Secondary | ICD-10-CM | POA: Diagnosis present

## 2020-06-16 DIAGNOSIS — D62 Acute posthemorrhagic anemia: Secondary | ICD-10-CM | POA: Diagnosis not present

## 2020-06-16 DIAGNOSIS — R9431 Abnormal electrocardiogram [ECG] [EKG]: Secondary | ICD-10-CM | POA: Diagnosis not present

## 2020-06-16 DIAGNOSIS — I10 Essential (primary) hypertension: Secondary | ICD-10-CM | POA: Diagnosis present

## 2020-06-16 DIAGNOSIS — Z7901 Long term (current) use of anticoagulants: Secondary | ICD-10-CM | POA: Diagnosis not present

## 2020-06-16 DIAGNOSIS — M1611 Unilateral primary osteoarthritis, right hip: Principal | ICD-10-CM

## 2020-06-16 DIAGNOSIS — E860 Dehydration: Secondary | ICD-10-CM | POA: Diagnosis present

## 2020-06-16 DIAGNOSIS — I959 Hypotension, unspecified: Secondary | ICD-10-CM | POA: Diagnosis not present

## 2020-06-16 DIAGNOSIS — I480 Paroxysmal atrial fibrillation: Secondary | ICD-10-CM | POA: Diagnosis present

## 2020-06-16 DIAGNOSIS — I493 Ventricular premature depolarization: Secondary | ICD-10-CM

## 2020-06-16 DIAGNOSIS — Z20822 Contact with and (suspected) exposure to covid-19: Secondary | ICD-10-CM | POA: Diagnosis present

## 2020-06-16 LAB — COMPREHENSIVE METABOLIC PANEL
ALT: 18 U/L (ref 0–44)
AST: 34 U/L (ref 15–41)
Albumin: 3.2 g/dL — ABNORMAL LOW (ref 3.5–5.0)
Alkaline Phosphatase: 40 U/L (ref 38–126)
Anion gap: 7 (ref 5–15)
BUN: 18 mg/dL (ref 8–23)
CO2: 21 mmol/L — ABNORMAL LOW (ref 22–32)
Calcium: 8.4 mg/dL — ABNORMAL LOW (ref 8.9–10.3)
Chloride: 99 mmol/L (ref 98–111)
Creatinine, Ser: 0.72 mg/dL (ref 0.44–1.00)
GFR, Estimated: >60 mL/min (ref >60)
Glucose, Bld: 137 mg/dL — ABNORMAL HIGH (ref 70–99)
Potassium: 4.5 mmol/L (ref 3.5–5.1)
Sodium: 127 mmol/L — ABNORMAL LOW (ref 135–145)
Total Bilirubin: 0.4 mg/dL (ref 0.3–1.2)
Total Protein: 5.2 g/dL — ABNORMAL LOW (ref 6.5–8.1)

## 2020-06-16 LAB — CBC
HCT: 23.8 % — ABNORMAL LOW (ref 36.0–46.0)
Hemoglobin: 7.9 g/dL — ABNORMAL LOW (ref 12.0–15.0)
MCH: 31.9 pg (ref 26.0–34.0)
MCHC: 33.2 g/dL (ref 30.0–36.0)
MCV: 96 fL (ref 80.0–100.0)
Platelets: 133 10*3/uL — ABNORMAL LOW (ref 150–400)
RBC: 2.48 MIL/uL — ABNORMAL LOW (ref 3.87–5.11)
RDW: 15.8 % — ABNORMAL HIGH (ref 11.5–15.5)
WBC: 8.5 10*3/uL (ref 4.0–10.5)
nRBC: 0 % (ref 0.0–0.2)

## 2020-06-16 LAB — PREPARE RBC (CROSSMATCH)

## 2020-06-16 LAB — ECHOCARDIOGRAM COMPLETE
Area-P 1/2: 3.34 cm2
Height: 63 in
S' Lateral: 2.7 cm
Weight: 2044.81 oz

## 2020-06-16 LAB — MAGNESIUM: Magnesium: 2.1 mg/dL (ref 1.7–2.4)

## 2020-06-16 MED ORDER — DOCUSATE SODIUM 100 MG PO CAPS: 100.0000 mg | ORAL_CAPSULE | Freq: Two times a day (BID) | ORAL | 1 refills | Status: DC

## 2020-06-16 MED ORDER — PANTOPRAZOLE SODIUM 40 MG PO TBEC
40.0000 mg | DELAYED_RELEASE_TABLET | Freq: Two times a day (BID) | ORAL | Status: DC
  Administered 2020-06-16 – 2020-06-17 (×3): 40 mg via ORAL
  Filled 2020-06-16 (×3): qty 1

## 2020-06-16 MED ORDER — HYDROCODONE-ACETAMINOPHEN 5-325 MG PO TABS: 1.0000 | ORAL_TABLET | ORAL | 0 refills | Status: DC | PRN

## 2020-06-16 MED ORDER — ONDANSETRON HCL 4 MG PO TABS: 4.0000 mg | ORAL_TABLET | Freq: Four times a day (QID) | ORAL | 0 refills | Status: DC | PRN

## 2020-06-16 MED ORDER — SODIUM CHLORIDE 0.9% IV SOLUTION: Freq: Once | INTRAVENOUS | Status: AC

## 2020-06-16 MED ORDER — SENNA 8.6 MG PO TABS: 2.0000 | ORAL_TABLET | Freq: Every day | ORAL | 1 refills | Status: DC

## 2020-06-16 MED ORDER — METOPROLOL TARTRATE 25 MG PO TABS
12.5000 mg | ORAL_TABLET | Freq: Two times a day (BID) | ORAL | Status: DC
  Filled 2020-06-16: qty 1

## 2020-06-16 NOTE — Progress Notes (Signed)
    Subjective:  Patient reports pain as mild to moderate.  Denies N/V/CP/SOB. No c/o. Hospitalist has ordered echo and cardiology consult.  Objective:   VITALS:   Vitals:   06/15/20 2203 06/16/20 0557 06/16/20 1220 06/16/20 1257  BP: 116/62 125/64 (!) 106/52 112/64  Pulse: 70 75 60 66  Resp: 20 20 14    Temp: 99.2 F (37.3 C) 97.8 F (36.6 C) 98.5 F (36.9 C) 98.6 F (37 C)  TempSrc: Oral Oral Oral Oral  SpO2: 92% 91% 95% 96%  Weight:      Height:        NAD ABD soft Sensation intact distally Intact pulses distally Dorsiflexion/Plantar flexion intact Incision: dressing C/D/I Compartment soft No hematoma  Lab Results  Component Value Date   WBC 8.5 06/16/2020   HGB 7.9 (L) 06/16/2020   HCT 23.8 (L) 06/16/2020   MCV 96.0 06/16/2020   PLT 133 (L) 06/16/2020   BMET    Component Value Date/Time   NA 127 (L) 06/16/2020 0447   K 4.5 06/16/2020 0447   CL 99 06/16/2020 0447   CO2 21 (L) 06/16/2020 0447   GLUCOSE 137 (H) 06/16/2020 0447   BUN 18 06/16/2020 0447   CREATININE 0.72 06/16/2020 0447   CALCIUM 8.4 (L) 06/16/2020 0447   GFRNONAA >60 06/16/2020 0447     Assessment/Plan: 2 Days Post-Op   Principal Problem:   Osteoarthritis of right hip Active Problems:   Primary osteoarthritis of right hip   Ventricular bigeminy   Hypotension   Acute anemia   WBAT with walker DVT ppx: Xarelto, SCDs, TEDS PO pain control PT/OT Medical management per hospitalist No evidence of bleeding / hematoma Dispo: d/c home with HEP when ok with hospitalist    14/04/2020 Abrie Egloff 06/16/2020, 1:42 PM   14/04/2020, MD [phone] [facility] is now [facility]  [place] [address], [place], [place] Phone: [phone] [email or web address] Facebook  [phone]

## 2020-06-16 NOTE — Progress Notes (Signed)
  Echocardiogram 2D Echocardiogram has been performed.  Melissa Price 06/16/2020, 2:38 PM

## 2020-06-16 NOTE — Progress Notes (Signed)
Physical Therapy Treatment Patient Details Name: Melissa Price MRN: [id number] DOB: [birth year] Today's Date: 06/16/2020    History of Present Illness Patient is 69 y.o. female s/p Rt THA anterior appraoch on 06/14/20 with PMH significant for HTN, osteopenia, GERD, OA, A-fib.    PT Comments    Pt assisted with ambulating in hallway and denies dizziness.  Pt anticipates d/c home tomorrow.  Pt still awaiting delivery of DME.   Follow Up Recommendations  Follow surgeon's recommendation for DC plan and follow-up therapies     Equipment Recommendations  Rolling walker with 5" wheels;3in1 (PT)    Recommendations for Other Services       Precautions / Restrictions Precautions Precautions: Fall Restrictions Other Position/Activity Restrictions: WBAT    Mobility  Bed Mobility Overal bed mobility: Needs Assistance Bed Mobility: Supine to Sit;Sit to Supine     Supine to sit: Min guard Sit to supine: Min guard   General bed mobility comments: pt self assisted Rt LE  Transfers Overall transfer level: Needs assistance Equipment used: Rolling walker (2 wheeled) Transfers: Sit to/from Stand Sit to Stand: Min guard         General transfer comment: verbal cues for UE and LE positioning  Ambulation/Gait Ambulation/Gait assistance: Min guard Gait Distance (Feet): 160 Feet Assistive device: Rolling walker (2 wheeled) Gait Pattern/deviations: Step-to pattern;Decreased stride length Gait velocity: decr   General Gait Details: verbal cues for step length and RW positioning, pt denies any dizziness, distance to tolerance   Stairs             Wheelchair Mobility    Modified Rankin (Stroke Patients Only)       Balance                                            Cognition Arousal/Alertness: Awake/alert Behavior During Therapy: WFL for tasks assessed/performed Overall Cognitive Status: Within Functional Limits for tasks assessed                                         Exercises      General Comments        Pertinent Vitals/Pain Pain Assessment: 0-10 Pain Score: 5  Pain Location: Rt hip Pain Descriptors / Indicators: Sore Pain Intervention(s): Repositioned;RN gave pain meds during session    Home Living                      Prior Function            PT Goals (current goals can now be found in the care plan section) Progress towards PT goals: Progressing toward goals    Frequency    7X/week      PT Plan Current plan remains appropriate    Co-evaluation              AM-PAC PT "6 Clicks" Mobility   Outcome Measure  Help needed turning from your back to your side while in a flat bed without using bedrails?: A Little Help needed moving from lying on your back to sitting on the side of a flat bed without using bedrails?: A Little Help needed moving to and from a bed to a chair (including a wheelchair)?: A Little Help needed standing up from a chair using  your arms (e.g., wheelchair or bedside chair)?: A Little Help needed to walk in hospital room?: A Little Help needed climbing 3-5 steps with a railing? : A Lot 6 Click Score: 17    End of Session Equipment Utilized During Treatment: Gait belt Activity Tolerance: Patient tolerated treatment well Patient left: with call bell/phone within Price;in bed;with bed alarm set Nurse Communication: Mobility status PT Visit Diagnosis: Muscle weakness (generalized) (M62.81);Difficulty in walking, not elsewhere classified (R26.2)     Time: 0488-8916 PT Time Calculation (min) (ACUTE ONLY): 22 min  Charges:  $Gait Training: 8-22 mins                     Paulino Door, DPT Acute Rehabilitation Services Pager: [phone] Office: [phone]  Maida Sale E 06/16/2020, 4:06 PM

## 2020-06-16 NOTE — Progress Notes (Signed)
PROGRESS NOTE  Melissa Price LKG:[id number] DOB: 06/17/1951 DOA: 06/14/2020 PCP: Janean Sark, NP   LOS: 0 days   Brief Narrative / Interim history: 69 year old female with history of paroxysmal A. fib on Xarelto, history of GI bleed, hypertension, osteoarthritis was admitted for elective right hip arthroplasty on 12/8 with Dr. Linna Caprice.  On 12/9, patient was found to be bradycardic and hypotensive and the hospitalist team was consulted.  Subjective / 24h Interval events: No complaints this morning, doing fairly well.  Assessment & Plan: Principal Problem Asymptomatic ventricular bigeminy, bradycardia, hypotension with underlying paroxysmal A. fib -Patient's primary cardiologist is in [place].  Prior to this surgery apparently she had a full cardiac evaluation including a stress test which per patient were negative. -Patient had a recent hospitalization in June 2021 at [facility] with GI bleed in the setting of NSAID use, and at that time she was noted to be significantly bradycardic at times in her metoprolol has been decreased from 100 mg daily to 12.5. -She tells me that she regularly checks her blood pressure at home and normally sees between 105-125 systolic with heart rate can fluctuate, does not remember exactly to what extent. -12/9 her heart rate is in the 30s and her systolic blood pressures in the 80s. -Obtain 2D echo today, I will place a cardiology consult to evaluate as she had some RVR at times on the monitor, concern for tachybradycardia syndrome  Active Problems Acute blood loss anemia, postoperatively -Hemoglobin 7.9 this morning, she received a unit of packed red blood cells yesterday.  Given her cardiac issue would like hemoglobin above 8, will transfuse an additional unit today.  Hold Xarelto  Recent GI bleed -This happened in June, she was admitted to [facility], she underwent an EGD which showed peptic ulcer disease in the setting of NSAID use for her  chronic hip pain.  Resume PPI today. -Denies any current bleeding symptoms, no melena  Osteoarthritis of right hip status post total hip arthroplasty on 12/8 -Per primary, PT evaluation ongoing  Hypomagnesemia -Repleted yesterday, normal this morning  Essential hypertension -She was hypotensive 12/9, hold amlodipine, metoprolol  Hyponatremia -Slightly dehydrated, will receive volume with blood  Hyperlipidemia -continue statin  Scheduled Meds: . sodium chloride   Intravenous Once  . sodium chloride   Intravenous Once  . atorvastatin  40 mg Oral QHS  . docusate sodium  100 mg Oral BID  . escitalopram  10 mg Oral QPM  . pantoprazole  40 mg Oral BID AC  . senna  2 tablet Oral QHS   Continuous Infusions: . sodium chloride 100 mL/hr at 06/16/20 0148  . methocarbamol (ROBAXIN) IV Stopped (06/14/20 1349)   PRN Meds:.acetaminophen, alum & mag hydroxide-simeth, diphenhydrAMINE, fluticasone, HYDROcodone-acetaminophen, HYDROcodone-acetaminophen, menthol-cetylpyridinium **OR** phenol, methocarbamol **OR** methocarbamol (ROBAXIN) IV, metoCLOPramide **OR** metoCLOPramide (REGLAN) injection, montelukast, morphine injection, naphazoline-glycerin, ondansetron **OR** ondansetron (ZOFRAN) IV, polyethylene glycol  Diet Orders (From admission, onward)    Start     Ordered   06/14/20 1248  Diet Heart Room service appropriate? Yes; Fluid consistency: Thin  Diet effective now       Question Answer Comment  Room service appropriate? Yes   Fluid consistency: Thin      06/14/20 1247          DVT prophylaxis: SCDs Start: 06/14/20 1248     Code Status: Full Code  Family Communication: no family at bedside   Status is: Observation  The patient will require care spanning > 2 midnights and should  be moved to inpatient because: Ongoing diagnostic testing needed not appropriate for outpatient work up and Inpatient level of care appropriate due to severity of illness  Dispo: The patient is from:  Home              Anticipated d/c is to: SNF              Anticipated d/c date is: 2 days              Patient currently is not medically stable to d/c.   Procedures:  2D echo: pending Total hip arthroplasty 12/8  Microbiology  None   Antimicrobials: None     Objective: Vitals:   06-19-20 1750 2020/06/19 2005 19-Jun-2020 2203 06/16/20 0557  BP: (!) 113/93 (!) 104/47 116/62 125/64  Pulse: 60 (!) 33 70 75  Resp: 16 20 20 20   Temp: 97.7 F (36.5 C) 98.4 F (36.9 C) 99.2 F (37.3 C) 97.8 F (36.6 C)  TempSrc: Oral Oral Oral Oral  SpO2: 96% 94% 92% 91%  Weight:      Height:        Intake/Output Summary (Last 24 hours) at 06/16/2020 1045 Last data filed at 06/16/2020 0200 Gross per 24 hour  Intake 2089.47 ml  Output 400 ml  Net 1689.47 ml   Filed Weights   06/14/20 0614 06/14/20 0638  Weight: 58 kg 58 kg    Examination:  Constitutional: NAD Eyes: no scleral icterus ENMT: Mucous membranes are moist.  Neck: normal, supple Respiratory: clear to auscultation bilaterally, no wheezing, no crackles. Normal respiratory effort. Cardiovascular: Regular rate and rhythm, no murmurs / rubs / gallops. No LE edema.  Abdomen: non distended, no tenderness. Bowel sounds positive.  Musculoskeletal: no clubbing / cyanosis.  Skin: no rashes Neurologic: CN 2-12 grossly intact. Strength 5/5 in all 4.    Data Reviewed: I have independently reviewed following labs and imaging studies `  CBC: Recent Labs  Lab 19-Jun-2020 0338 06/19/2020 1040 06/19/2020 2104 06/16/20 0447  WBC 9.4  --   --  8.5  HGB 7.0* 7.3* 8.3* 7.9*  HCT 22.1* 22.7* 25.4* 23.8*  MCV 103.3*  --   --  96.0  PLT 150  --   --  133*   Basic Metabolic Panel: Recent Labs  Lab 2020-06-19 0338 06/16/20 0447  NA 134* 127*  K 4.4 4.5  CL 103 99  CO2 22 21*  GLUCOSE 218* 137*  BUN 19 18  CREATININE 0.84 0.72  CALCIUM 8.6* 8.4*  MG 1.4* 2.1   Liver Function Tests: Recent Labs  Lab 06/16/20 0447  AST 34  ALT 18   ALKPHOS 40  BILITOT 0.4  PROT 5.2*  ALBUMIN 3.2*   Coagulation Profile: No results for input(s): INR, PROTIME in the last 168 hours. HbA1C: No results for input(s): HGBA1C in the last 72 hours. CBG: No results for input(s): GLUCAP in the last 168 hours.  Recent Results (from the past 240 hour(s))  Surgical pcr screen     Status: None   Collection Time: 06/06/20  2:12 PM   Specimen: Nasal Mucosa; Nasal Swab  Result Value Ref Range Status   MRSA, PCR NEGATIVE NEGATIVE Final   Staphylococcus aureus NEGATIVE NEGATIVE Final    Comment: (NOTE) The Xpert SA Assay (FDA approved for NASAL specimens in patients 9 years of age and older), is one component of a comprehensive surveillance program. It is not intended to diagnose infection nor to guide or monitor treatment. Performed at [facility]  [facility], [address] [place], [place] [address]   SARS CORONAVIRUS 2 (TAT 6-24 HRS) Nasopharyngeal Nasopharyngeal Swab     Status: None   Collection Time: 06/10/20 12:19 PM   Specimen: Nasopharyngeal Swab  Result Value Ref Range Status   SARS Coronavirus 2 NEGATIVE NEGATIVE Final    Comment: (NOTE) SARS-CoV-2 target nucleic acids are NOT DETECTED.  The SARS-CoV-2 RNA is generally detectable in upper and lower respiratory specimens during the acute phase of infection. Negative results do not preclude SARS-CoV-2 infection, do not rule out co-infections with other pathogens, and should not be used as the sole basis for treatment or other patient management decisions. Negative results must be combined with clinical observations, patient history, and epidemiological information. The expected result is Negative.  Fact Sheet for Patients: HairSlick.no  Fact Sheet for Healthcare Providers: quierodirigir.com  This test is not yet approved or cleared by the [place] FDA and  has been authorized for detection and/or  diagnosis of SARS-CoV-2 by FDA under an Emergency Use Authorization (EUA). This EUA will remain  in effect (meaning this test can be used) for the duration of the COVID-19 declaration under Se ction 564(b)(1) of the Act, 21 U.S.C. section 360bbb-3(b)(1), unless the authorization is terminated or revoked sooner.  Performed at [facility], [address], [place], [place] [address]      Radiology Studies: No results found.  Pamella Pert, MD, PhD [facility]  Between 7 am - 7 pm I am available, please contact me via Amion or Securechat  Between 7 pm - 7 am I am not available, please contact night coverage MD/APP via Amion

## 2020-06-16 NOTE — Consult Note (Signed)
Cardiology Consultation:   Patient ID: Melissa HastenDeborah Price MRN: [id number]; DOB: [birth year]  Admit date: 06/14/2020 Date of Consult: 06/16/2020  Primary Care Provider: Janean Sarkichardson, Jennifer P, NP [facility] Cardiologist: Daryel NovemberGary Miller, MD  [facility] Electrophysiologist:  None    Patient Profile:   Melissa Price is a 69 y.o. female with a PMH of paroxysmal atrial fibrillation, HTN, HLD, GI bleed 2/2 NSAID use, and osteoarthritis s/p hip replacement 06/14/20, who is being seen today for the evaluation of bradycardia, ventricular bigeminy, and intermittent tachycardia at the request of Dr. Elvera LennoxGherghe.  History of Present Illness:   Ms. Jacqulyn DuckingFerrell presented to the hospital 06/14/20 for elective right hip arthroplasty with Dr. Linna CapriceSwinteck. Her post-op course was complicated by bradycardia with HR in the 30s and hypotension with BP in the 80s/40s prompting a rapid response and medicine consult. Reportedly she was asymptomatic at the time. EKG showed ventricular bigeminy. Blood pressures improved with IVFs. Her Bblocker was held, in addition to her amlodipine. She has also had acute blood loss anemia with drop in Hgb from 12.1 preop to 7.0 post-op. She received 1 uPRBC with improvement in Hgb to 8.3 with subsequent drop to 7.9 with plans to receive another unit of PRBC today.   [place] She reports having a full cardiac work-up prior to her hip surgery including a stress test which she states was normal. She denies prior trouble with CHF or CAD. She reports being diagnosed with atrial fibrillation ~20 years ago. She is generally unaware of her afib episodes, though does still have occasional palpitations. She reports having bigeminy at the time of her stress test 04/2020. Prior to this she was hospitalized 12/2019 for GI bleed 2/2 NSAID use with chronic xarelto use. She had some bradycardia during that admission and her metoprolol was reduced from 50mg  BID to 12.5mg  BID. She  did note increased palpitations with the lower dose and her metoprolol was subsequently increased to 25mg  BID at the time of her stress test with improvement in symptoms. She reports rare episodes of lightheadedness which often occur with position changes, though no dizziness or syncope. She has no complaints of chest pain, SOB, DOE, orthopnea, PND, or LE edema.      Past Medical History:  Diagnosis Date  . Arthritis   . Atrial fibrillation (HCC)   . Complication of anesthesia   . Dysrhythmia    afib   . GERD (gastroesophageal reflux disease)   . History of osteopenia    both hips  . Hypertension   . PONV (postoperative nausea and vomiting)     Past Surgical History:  Procedure Laterality Date  . KNEE SURGERY Right 2000   Dr. Ronney AstersHermann - Arthroscopic  . TOTAL HIP ARTHROPLASTY Right 06/14/2020   Procedure: TOTAL HIP ARTHROPLASTY ANTERIOR APPROACH;  Surgeon: Samson FredericSwinteck, Brian, MD;  Location: [facility];  Service: Orthopedics;  Laterality: Right;     Home Medications:  Prior to Admission medications   Medication Sig Start Date End Date Taking? Authorizing Provider  amLODipine (NORVASC) 5 MG tablet Take 5 mg by mouth daily.   Yes [provider]  atorvastatin (LIPITOR) 40 MG tablet Take 40 mg by mouth at bedtime. 03/22/20  Yes [provider]  escitalopram (LEXAPRO) 10 MG tablet Take 10 mg by mouth every evening.    Yes [provider]  fluticasone (FLONASE) 50 MCG/ACT nasal spray Place 1 spray into both nostrils daily as needed (allergies.).  04/17/20  Yes [provider]  metoprolol tartrate (LOPRESSOR) 25 MG tablet Take 25 mg by mouth 2 (two) times daily. 05/12/20  Yes [provider]  montelukast (SINGULAIR) 10 MG tablet Take 10 mg by mouth daily as needed (allergies.).  01/09/20  Yes [provider]  rivaroxaban (XARELTO) 20 MG TABS tablet Take 20 mg by mouth every evening.    Yes [provider]  tetrahydrozoline 0.05 %  ophthalmic solution Place 1-2 drops into both eyes 3 (three) times daily as needed (red/irritated eyes.).   Yes [provider]  docusate sodium (COLACE) 100 MG capsule Take 1 capsule (100 mg total) by mouth 2 (two) times daily. 06/16/20   Swinteck, Arlys John, MD  HYDROcodone-acetaminophen (NORCO/VICODIN) 5-325 MG tablet Take 1 tablet by mouth every 4 (four) hours as needed for moderate pain (pain score 4-6). 06/16/20   Swinteck, Arlys John, MD  ondansetron (ZOFRAN) 4 MG tablet Take 1 tablet (4 mg total) by mouth every 6 (six) hours as needed for nausea. 06/16/20   Swinteck, Arlys John, MD  senna (SENOKOT) 8.6 MG TABS tablet Take 2 tablets (17.2 mg total) by mouth at bedtime. 06/16/20   Samson Frederic, MD    Inpatient Medications: Scheduled Meds: . sodium chloride   Intravenous Once  . atorvastatin  40 mg Oral QHS  . docusate sodium  100 mg Oral BID  . escitalopram  10 mg Oral QPM  . pantoprazole  40 mg Oral BID AC  . senna  2 tablet Oral QHS   Continuous Infusions: . methocarbamol (ROBAXIN) IV Stopped (06/14/20 1349)   PRN Meds: acetaminophen, alum & mag hydroxide-simeth, diphenhydrAMINE, fluticasone, HYDROcodone-acetaminophen, HYDROcodone-acetaminophen, menthol-cetylpyridinium **OR** phenol, methocarbamol **OR** methocarbamol (ROBAXIN) IV, metoCLOPramide **OR** metoCLOPramide (REGLAN) injection, montelukast, morphine injection, naphazoline-glycerin, ondansetron **OR** ondansetron (ZOFRAN) IV, polyethylene glycol  Allergies:    Allergies  Allergen Reactions  . Penicillins Shortness Of Breath    amoxicillin    Social History:   Social History   Socioeconomic History  . Marital status: Married    Spouse name: Not on file  . Number of children: Not on file  . Years of education: Not on file  . Highest education level: Not on file  Occupational History  . Not on file  Tobacco Use  . Smoking status: Never Smoker  . Smokeless tobacco: Never Used  Vaping Use  . Vaping Use: Never  used  Substance and Sexual Activity  . Alcohol use: Yes    Comment: occasoinal   . Drug use: Never  . Sexual activity: Never  Other Topics Concern  . Not on file  Social History Narrative  . Not on file   Social Determinants of Health   Financial Resource Strain: Not on file  Food Insecurity: Not on file  Transportation Needs: Not on file  Physical Activity: Not on file  Stress: Not on file  Social Connections: Not on file  Intimate Partner Violence: Not on file    Family History:    Family History  Problem Relation Age of Onset  . Heart disease Mother   . Heart disease Father      ROS:  Please see the history of present illness.   All other ROS reviewed and negative.     Physical Exam/Data:   Vitals:   06/16/20 1257 06/16/20 1510 06/16/20 1511 06/16/20 1514  BP: 112/64 (!) 109/57  (!) 109/57  Pulse: 66 81  81  Resp:  18  18  Temp: 98.6 F (37 C)  100.3 F (37.9 C) 98.3 F (36.8 C)  TempSrc: Oral   Oral  SpO2: 96% 97%  97%  Weight:      Height:        Intake/Output Summary (Last 24 hours) at 06/16/2020 1518 Last data filed at 06/16/2020 1512 Gross per 24 hour  Intake 2389.47 ml  Output 400 ml  Net 1989.47 ml   Last 3 Weights 06/14/2020 06/14/2020  Weight (lbs) 127 lb 12.8 oz 127 lb 12.8 oz  Weight (kg) 57.97 kg 57.97 kg     Body mass index is 22.64 kg/m.  General:  Well nourished, well developed, in no acute distress HEENT: normal Lymph: no adenopathy Neck: no JVD Endocrine:  No thryomegaly Vascular: No carotid bruits; distal pulses 2+ bilaterally  Cardiac:  normal S1, S2; RRR; +murmur, no rubs or gallops Lungs:  clear to auscultation bilaterally, no wheezing, rhonchi or rales  Abd: soft, nontender, no hepatomegaly  Ext: no edema, TED hose in place Musculoskeletal:  No deformities, BUE and BLE strength normal and equal Skin: warm and dry  Neuro:  CNs 2-12 intact, no focal abnormalities noted Psych:  Normal affect   EKG:  The EKG was  personally reviewed and demonstrates:  None in our system Telemetry:  Telemetry was personally reviewed and demonstrates:  Ventricular bigeminy/trigeminy with no significant bradycardia and 2 very brief episodes of atrial tachycardia  Relevant CV Studies: Echo pending  Laboratory Data:  High Sensitivity Troponin:  No results for input(s): TROPONINIHS in the last 720 hours.   Chemistry Recent Labs  Lab 06/15/20 0338 06/16/20 0447  NA 134* 127*  K 4.4 4.5  CL 103 99  CO2 22 21*  GLUCOSE 218* 137*  BUN 19 18  CREATININE 0.84 0.72  CALCIUM 8.6* 8.4*  GFRNONAA >60 >60  ANIONGAP 9 7    Recent Labs  Lab 06/16/20 0447  PROT 5.2*  ALBUMIN 3.2*  AST 34  ALT 18  ALKPHOS 40  BILITOT 0.4   Hematology Recent Labs  Lab 06/15/20 0338 06/15/20 1040 06/15/20 2104 06/16/20 0447  WBC 9.4  --   --  8.5  RBC 2.14*  --   --  2.48*  HGB 7.0* 7.3* 8.3* 7.9*  HCT 22.1* 22.7* 25.4* 23.8*  MCV 103.3*  --   --  96.0  MCH 32.7  --   --  31.9  MCHC 31.7  --   --  33.2  RDW 12.9  --   --  15.8*  PLT 150  --   --  133*   BNPNo results for input(s): BNP, PROBNP in the last 168 hours.  DDimer No results for input(s): DDIMER in the last 168 hours.   Radiology/Studies:  DG Pelvis Portable  Result Date: 06/14/2020 CLINICAL DATA:  Follow-up right hip arthroplasty. EXAM: PORTABLE PELVIS 1-2 VIEWS COMPARISON:  Earlier same day. FINDINGS: Total hip arthroplasty on the right. Components appear well positioned. No radiographically detectable complication or unexpected finding. Chronic osteoarthritis of the left hip. IMPRESSION: Good appearance following total hip arthroplasty on the right. Electronically Signed   By: Paulina Fusi M.D.   On: 06/14/2020 11:50   DG C-Arm 1-60 Min-No Report  Result Date: 06/14/2020 Fluoroscopy was utilized by the requesting physician.  No radiographic interpretation.   DG HIP OPERATIVE UNILAT W OR W/O PELVIS RIGHT  Result Date: 06/14/2020 CLINICAL DATA:  Hip  replacement EXAM: OPERATIVE RIGHT HIP (WITH PELVIS IF PERFORMED) 2 VIEWS TECHNIQUE: Fluoroscopic spot image(s) were submitted for interpretation post-operatively. COMPARISON:  None. FINDINGS: Two intraoperative spot images demonstrate changes  of right hip replacement. Normal AP alignment. No hardware complicating feature. IMPRESSION: Right hip replacement.  No visible complicating feature. Electronically Signed   By: Charlett Nose M.D.   On: 06/14/2020 10:27     Assessment and Plan:   1. Bradycardia/bigeminy: patient noted to have HR in the 30s following hip replacement surgery 06/14/20. She was hypotensive with BP 80s/40s at the time but reported to be asymptomatic. She was given IVFs with improvement in BP and home BBlocker was held. She was placed on telemetry without recurrence. She has had persistent bigeminy/trigeminy this admission though is not terribly symptomatic from this. No symptoms to suggest symptomatic bradycardia. Suspect her episode yesterday was a reaction to anesthesia - Favor restarting low dose metoprolol  2. Paroxysmal atrial fibrillation: predominate rhythm this admission is bigeminy/trigeminy with 2 very short episodes of atrial tachycardia. Home xarelto held in preparation for surgery. She has had post-op anemia requiring 2 uPRBC this admission.  - Would restart xarelto when cleared to do so by her surgeon - Favor restarting low dose metoprolol as above  3. HTN: BP soft in the post-op setting. Home amlodipine and metoprolol tartrate on hold  - Favor restarting metoprolol with hold parameters - Continue to monitor closely and reintroduce home amlodipine as needed  4. HLD: no lipids on file - Continue statin     CHA2DS2-VASc Score = 3  This indicates a 3.2% annual risk of stroke. The patient's score is based upon: CHF History: No HTN History: Yes Diabetes History: No Stroke History: No Vascular Disease History: No Age Score: 1 Gender Score: 1        For  questions or updates, please contact [facility] Please consult [email or web address] for contact info under    Signed, Beatriz Stallion, PA-C  06/16/2020 3:18 PM

## 2020-06-16 NOTE — Plan of Care (Signed)

## 2020-06-16 NOTE — Progress Notes (Signed)
Physical Therapy Treatment Patient Details Name: Melissa Price MRN: [id number] DOB: 10-10-1950 Today's Date: 06/16/2020    History of Present Illness Patient is 69 y.o. female s/p Rt THA anterior appraoch on 06/14/20 with PMH significant for HTN, osteopenia, GERD, OA, A-fib.    PT Comments    Pt reports performing LE exercises in bed this morning (had HEP handout in bed with her).  Pt assisted with ambulating in hallway and left up in recliner.  Pt to receive another unit of PRBCs today.   Follow Up Recommendations  Follow surgeon's recommendation for DC plan and follow-up therapies     Equipment Recommendations  Rolling walker with 5" wheels;3in1 (PT)    Recommendations for Other Services       Precautions / Restrictions Precautions Precautions: Fall Restrictions Other Position/Activity Restrictions: WBAT    Mobility  Bed Mobility Overal bed mobility: Needs Assistance Bed Mobility: Supine to Sit     Supine to sit: Min guard     General bed mobility comments: pt self assisted Rt LE  Transfers Overall transfer level: Needs assistance Equipment used: Rolling walker (2 wheeled) Transfers: Sit to/from Stand Sit to Stand: Min guard         General transfer comment: verbal cues for UE and LE positioning  Ambulation/Gait Ambulation/Gait assistance: Min guard Gait Distance (Feet): 160 Feet Assistive device: Rolling walker (2 wheeled) Gait Pattern/deviations: Step-to pattern;Decreased stride length Gait velocity: decr   General Gait Details: verbal cues for step length and RW positioning, pt denies any dizziness, distance to tolerance   Stairs             Wheelchair Mobility    Modified Rankin (Stroke Patients Only)       Balance                                            Cognition Arousal/Alertness: Awake/alert Behavior During Therapy: WFL for tasks assessed/performed Overall Cognitive Status: Within Functional Limits for  tasks assessed                                        Exercises      General Comments        Pertinent Vitals/Pain Pain Assessment: 0-10 Pain Score: 2  Pain Location: Rt hip Pain Descriptors / Indicators: Sore Pain Intervention(s): Repositioned;Monitored during session    Home Living                      Prior Function            PT Goals (current goals can now be found in the care plan section) Progress towards PT goals: Progressing toward goals    Frequency    7X/week      PT Plan Current plan remains appropriate    Co-evaluation              AM-PAC PT "6 Clicks" Mobility   Outcome Measure  Help needed turning from your back to your side while in a flat bed without using bedrails?: A Little Help needed moving from lying on your back to sitting on the side of a flat bed without using bedrails?: A Little Help needed moving to and from a bed to a chair (including a wheelchair)?: A Little Help  needed standing up from a chair using your arms (e.g., wheelchair or bedside chair)?: A Little Help needed to walk in hospital room?: A Little Help needed climbing 3-5 steps with a railing? : A Lot 6 Click Score: 17    End of Session Equipment Utilized During Treatment: Gait belt Activity Tolerance: Patient tolerated treatment well Patient left: in chair;with call bell/phone within reach;with chair alarm set Nurse Communication: Mobility status PT Visit Diagnosis: Muscle weakness (generalized) (M62.81);Difficulty in walking, not elsewhere classified (R26.2)     Time: 8850-2774 PT Time Calculation (min) (ACUTE ONLY): 22 min  Charges:  $Gait Training: 8-22 mins                    Paulino Door, DPT Acute Rehabilitation Services Pager: [phone] Office: [phone] Sarajane Jews 06/16/2020, 3:23 PM

## 2020-06-17 DIAGNOSIS — R001 Bradycardia, unspecified: Secondary | ICD-10-CM

## 2020-06-17 DIAGNOSIS — I480 Paroxysmal atrial fibrillation: Secondary | ICD-10-CM

## 2020-06-17 DIAGNOSIS — I10 Essential (primary) hypertension: Secondary | ICD-10-CM

## 2020-06-17 LAB — BPAM RBC
Blood Product Expiration Date: 202201032359
Blood Product Expiration Date: 202201082359
ISSUE DATE / TIME: 202112091445
ISSUE DATE / TIME: 202112101232
Unit Type and Rh: 9500
Unit Type and Rh: 9500

## 2020-06-17 LAB — TYPE AND SCREEN
ABO/RH(D): O NEG
Antibody Screen: NEGATIVE
Unit division: 0
Unit division: 0

## 2020-06-17 LAB — COMPREHENSIVE METABOLIC PANEL
ALT: 18 U/L (ref 0–44)
AST: 28 U/L (ref 15–41)
Albumin: 3.1 g/dL — ABNORMAL LOW (ref 3.5–5.0)
Alkaline Phosphatase: 42 U/L (ref 38–126)
Anion gap: 8 (ref 5–15)
BUN: 11 mg/dL (ref 8–23)
CO2: 24 mmol/L (ref 22–32)
Calcium: 8.8 mg/dL — ABNORMAL LOW (ref 8.9–10.3)
Chloride: 104 mmol/L (ref 98–111)
Creatinine, Ser: 0.77 mg/dL (ref 0.44–1.00)
GFR, Estimated: >60 mL/min (ref >60)
Glucose, Bld: 99 mg/dL (ref 70–99)
Potassium: 4.2 mmol/L (ref 3.5–5.1)
Sodium: 136 mmol/L (ref 135–145)
Total Bilirubin: 0.7 mg/dL (ref 0.3–1.2)
Total Protein: 5.3 g/dL — ABNORMAL LOW (ref 6.5–8.1)

## 2020-06-17 LAB — CBC
HCT: 27.8 % — ABNORMAL LOW (ref 36.0–46.0)
Hemoglobin: 9.3 g/dL — ABNORMAL LOW (ref 12.0–15.0)
MCH: 31.8 pg (ref 26.0–34.0)
MCHC: 33.5 g/dL (ref 30.0–36.0)
MCV: 95.2 fL (ref 80.0–100.0)
Platelets: 143 10*3/uL — ABNORMAL LOW (ref 150–400)
RBC: 2.92 MIL/uL — ABNORMAL LOW (ref 3.87–5.11)
RDW: 15.9 % — ABNORMAL HIGH (ref 11.5–15.5)
WBC: 7.2 10*3/uL (ref 4.0–10.5)
nRBC: 0 % (ref 0.0–0.2)

## 2020-06-17 LAB — MAGNESIUM: Magnesium: 1.7 mg/dL (ref 1.7–2.4)

## 2020-06-17 MED ORDER — MAGNESIUM SULFATE 2 GM/50ML IV SOLN
2.0000 g | Freq: Once | INTRAVENOUS | Status: AC
  Administered 2020-06-17: 11:00:00 2 g via INTRAVENOUS
  Filled 2020-06-17: qty 50

## 2020-06-17 MED ORDER — MAGNESIUM SULFATE 2 GM/50ML IV SOLN
2.0000 g | Freq: Once | INTRAVENOUS | Status: DC
  Filled 2020-06-17: qty 50

## 2020-06-17 MED ORDER — METOPROLOL TARTRATE 25 MG PO TABS
25.0000 mg | ORAL_TABLET | Freq: Two times a day (BID) | ORAL | Status: DC
  Administered 2020-06-17: 11:00:00 25 mg via ORAL
  Filled 2020-06-17: qty 1

## 2020-06-17 NOTE — Progress Notes (Addendum)
PROGRESS NOTE  Melissa Price FSF:[id number] DOB: February 03, 1951 DOA: 06/14/2020 PCP: Janean Sark, NP   LOS: 1 day   Brief Narrative / Interim history: 69 year old female with history of paroxysmal A. fib on Xarelto, history of GI bleed, hypertension, osteoarthritis was admitted for elective right hip arthroplasty on 12/8 with Dr. Linna Caprice.  On 12/9, patient was found to be bradycardic and hypotensive and the hospitalist team was consulted.  Subjective / 24h Interval events: No complaints, doing well.  No chest pain, no shortness of breath  Assessment & Plan: Principal Problem Asymptomatic ventricular bigeminy, bradycardia, hypotension with underlying paroxysmal A. fib -Patient's primary cardiologist is in [place].  Prior to this surgery apparently she had a full cardiac evaluation including a stress test which per patient were negative. -Patient had a recent hospitalization in June 2021 at [facility] with GI bleed in the setting of NSAID use, and at that time she was noted to be significantly bradycardic at times in her metoprolol has been decreased from 100 mg daily to 12.5. -12/9 her heart rate is in the 30s and her systolic blood pressures in the 80s which prompted our consult -Cardiology consulted, appreciate input, 2D echo with normal EF.  Metoprolol back to home dose this morning of 25 twice daily.  OK to go home from medicine standpoint  Active Problems Acute blood loss anemia, postoperatively -Received 2 units of packed red blood cells, hemoglobin improved appropriately.  Resume Xarelto per primary  Recent GI bleed -This happened in June, she was admitted to [facility], she underwent an EGD which showed peptic ulcer disease in the setting of NSAID use for her chronic hip pain.  Resume PPI today. -Denies any current bleeding symptoms, no melena stopped hemoglobin stable  Osteoarthritis of right hip status post total hip arthroplasty on 12/8 -Per primary, PT evaluation  ongoing, will go home with home health  Hypomagnesemia -We will give another 2 g of mag today  Essential hypertension -Resume home metoprolol  Hyponatremia -Slightly dehydrated, will receive volume with blood  Hyperlipidemia -continue statin  Scheduled Meds: . sodium chloride   Intravenous Once  . atorvastatin  40 mg Oral QHS  . docusate sodium  100 mg Oral BID  . escitalopram  10 mg Oral QPM  . metoprolol tartrate  25 mg Oral BID  . pantoprazole  40 mg Oral BID AC  . senna  2 tablet Oral QHS   Continuous Infusions: . magnesium sulfate bolus IVPB    . methocarbamol (ROBAXIN) IV Stopped (06/14/20 1349)   PRN Meds:.acetaminophen, alum & mag hydroxide-simeth, diphenhydrAMINE, fluticasone, HYDROcodone-acetaminophen, HYDROcodone-acetaminophen, menthol-cetylpyridinium **OR** phenol, methocarbamol **OR** methocarbamol (ROBAXIN) IV, metoCLOPramide **OR** metoCLOPramide (REGLAN) injection, montelukast, morphine injection, naphazoline-glycerin, ondansetron **OR** ondansetron (ZOFRAN) IV, polyethylene glycol  Diet Orders (From admission, onward)    Start     Ordered   06/14/20 1248  Diet Heart Room service appropriate? Yes; Fluid consistency: Thin  Diet effective now       Question Answer Comment  Room service appropriate? Yes   Fluid consistency: Thin      06/14/20 1247          DVT prophylaxis: SCDs Start: 06/14/20 1248     Code Status: Full Code  Family Communication: no family at bedside   Status is: Observation  The patient will require care spanning > 2 midnights and should be moved to inpatient because: Ongoing diagnostic testing needed not appropriate for outpatient work up and Inpatient level of care appropriate due to severity of illness  Dispo: The patient is from: Home              Anticipated d/c is to: SNF              Anticipated d/c date is: 1 day              Patient currently is not medically stable to d/c.   Procedures:  2D echo: pending Total hip  arthroplasty 12/8  Microbiology  None   Antimicrobials: None     Objective: Vitals:   06/16/20 1511 06/16/20 1514 06/16/20 1948 06/17/20 0408  BP:  (!) 109/57 115/61 115/67  Pulse:  81 (!) 50 66  Resp:  18 20 18   Temp: 100.3 F (37.9 C) 98.3 F (36.8 C) 98.7 F (37.1 C) 98.7 F (37.1 C)  TempSrc:  Oral Oral Oral  SpO2:  97% 96% 97%  Weight:      Height:        Intake/Output Summary (Last 24 hours) at 06/17/2020 0835 Last data filed at 06/17/2020 [id number] Gross per 24 hour  Intake 300 ml  Output 2800 ml  Net -2500 ml   Filed Weights   06/14/20 0614 06/14/20 [id number]  Weight: 58 kg 58 kg    Examination:  Constitutional: She is in no distress, comfortable, in bed Eyes: No scleral icterus ENMT: mmm Neck: normal, supple Respiratory: Lungs are clear bilaterally without wheezing Cardiovascular: Heart is regular, extra PVCs occasionally, no edema Abdomen: Soft, NT, ND, bowel sounds positive Musculoskeletal: no clubbing / cyanosis.  Skin: No rashes Neurologic: Nonfocal   Data Reviewed: I have independently reviewed following labs and imaging studies `  CBC: Recent Labs  Lab 06/15/20 0338 06/15/20 1040 06/15/20 2104 06/16/20 0447 06/17/20 0531  WBC 9.4  --   --  8.5 7.2  HGB 7.0* 7.3* 8.3* 7.9* 9.3*  HCT 22.1* 22.7* 25.4* 23.8* 27.8*  MCV 103.3*  --   --  96.0 95.2  PLT 150  --   --  133* 143*   Basic Metabolic Panel: Recent Labs  Lab 06/15/20 0338 06/16/20 0447 06/17/20 0531  NA 134* 127* 136  K 4.4 4.5 4.2  CL 103 99 104  CO2 22 21* 24  GLUCOSE 218* 137* 99  BUN 19 18 11   CREATININE 0.84 0.72 0.77  CALCIUM 8.6* 8.4* 8.8*  MG 1.4* 2.1 1.7   Liver Function Tests: Recent Labs  Lab 06/16/20 0447 06/17/20 0531  AST 34 28  ALT 18 18  ALKPHOS 40 42  BILITOT 0.4 0.7  PROT 5.2* 5.3*  ALBUMIN 3.2* 3.1*   Coagulation Profile: No results for input(s): INR, PROTIME in the last 168 hours. HbA1C: No results for input(s): HGBA1C in the last 72  hours. CBG: No results for input(s): GLUCAP in the last 168 hours.  Recent Results (from the past 240 hour(s))  SARS CORONAVIRUS 2 (TAT 6-24 HRS) Nasopharyngeal Nasopharyngeal Swab     Status: None   Collection Time: 06/10/20 12:19 PM   Specimen: Nasopharyngeal Swab  Result Value Ref Range Status   SARS Coronavirus 2 NEGATIVE NEGATIVE Final    Comment: (NOTE) SARS-CoV-2 target nucleic acids are NOT DETECTED.  The SARS-CoV-2 RNA is generally detectable in upper and lower respiratory specimens during the acute phase of infection. Negative results do not preclude SARS-CoV-2 infection, do not rule out co-infections with other pathogens, and should not be used as the sole basis for treatment or other patient management decisions. Negative results must be combined with clinical observations,  patient history, and epidemiological information. The expected result is Negative.  Fact Sheet for Patients: HairSlick.no  Fact Sheet for Healthcare Providers: quierodirigir.com  This test is not yet approved or cleared by the [place] FDA and  has been authorized for detection and/or diagnosis of SARS-CoV-2 by FDA under an Emergency Use Authorization (EUA). This EUA will remain  in effect (meaning this test can be used) for the duration of the COVID-19 declaration under Se ction 564(b)(1) of the Act, 21 U.S.C. section 360bbb-3(b)(1), unless the authorization is terminated or revoked sooner.  Performed at [facility], [address], [place], [place] [address]      Radiology Studies: ECHOCARDIOGRAM COMPLETE  Result Date: 06/16/2020    ECHOCARDIOGRAM REPORT   Patient Name:   Melissa Price Date of Exam: 06/16/2020 Medical Rec #:  [id number]       Height:       63.0 in Accession #:    [id number]      Weight:       127.8 lb Date of Birth:  04-Jan-1951      BSA:          1.599 m Patient Age:    69 years        BP:           125/64  mmHg Patient Gender: F               HR:           80 bpm. Exam Location:  Inpatient Procedure: 2D Echo, Cardiac Doppler and Color Doppler Indications:    R94.31 Abnormal EKG  History:        Patient has no prior history of Echocardiogram examinations.                 Arrythmias:Atrial Fibrillation; Risk Factors:Hypertension and                 GERD.  Sonographer:    Elmarie Shiley Dance Referring Phys: [id number] Daylene Katayama Nazifa Trinka IMPRESSIONS  1. Left ventricular ejection fraction, by estimation, is 60 to 65%. The left ventricle has normal function. The left ventricle has no regional wall motion abnormalities. Left ventricular diastolic parameters were normal.  2. Right ventricular systolic function is normal. The right ventricular size is mildly enlarged. There is moderately elevated pulmonary artery systolic pressure. The estimated right ventricular systolic pressure is 56.7 mmHg.  3. Left atrial size was severely dilated.  4. Right atrial size was severely dilated.  5. The pericardial effusion is circumferential. There is no evidence of cardiac tamponade.  6. The mitral valve is normal in structure. Mild mitral valve regurgitation. No evidence of mitral stenosis.  7. The aortic valve is tricuspid. Aortic valve regurgitation is not visualized. Mild aortic valve sclerosis is present, with no evidence of aortic valve stenosis.  8. The inferior vena cava is dilated in size with <50% respiratory variability, suggesting right atrial pressure of 15 mmHg. FINDINGS  Left Ventricle: Left ventricular ejection fraction, by estimation, is 60 to 65%. The left ventricle has normal function. The left ventricle has no regional wall motion abnormalities. The left ventricular internal cavity size was normal in size. There is  no left ventricular hypertrophy. Left ventricular diastolic parameters were normal. Right Ventricle: The right ventricular size is mildly enlarged. No increase in right ventricular wall thickness. Right ventricular  systolic function is normal. There is moderately elevated pulmonary artery systolic pressure. The tricuspid regurgitant velocity is 3.49 m/s, and with an  assumed right atrial pressure of 8 mmHg, the estimated right ventricular systolic pressure is 56.7 mmHg. Left Atrium: Left atrial size was severely dilated. Right Atrium: Right atrial size was severely dilated. Pericardium: Trivial pericardial effusion is present. The pericardial effusion is circumferential. There is no evidence of cardiac tamponade. Mitral Valve: The mitral valve is normal in structure. Mild mitral valve regurgitation. No evidence of mitral valve stenosis. Tricuspid Valve: The tricuspid valve is normal in structure. Tricuspid valve regurgitation is mild . No evidence of tricuspid stenosis. Aortic Valve: The aortic valve is tricuspid. Aortic valve regurgitation is not visualized. Mild aortic valve sclerosis is present, with no evidence of aortic valve stenosis. Pulmonic Valve: The pulmonic valve was normal in structure. Pulmonic valve regurgitation is not visualized. No evidence of pulmonic stenosis. Aorta: The aortic root is normal in size and structure. Venous: The inferior vena cava is dilated in size with less than 50% respiratory variability, suggesting right atrial pressure of 15 mmHg. IAS/Shunts: No atrial level shunt detected by color flow Doppler.  LEFT VENTRICLE PLAX 2D LVIDd:         4.60 cm  Diastology LVIDs:         2.70 cm  LV e' medial:    10.80 cm/s LV PW:         1.20 cm  LV E/e' medial:  10.9 LV IVS:        0.80 cm  LV e' lateral:   12.60 cm/s LVOT diam:     1.70 cm  LV E/e' lateral: 9.4 LV SV:         50 LV SV Index:   32 LVOT Area:     2.27 cm  RIGHT VENTRICLE             IVC RV Basal diam:  2.60 cm     IVC diam: 2.50 cm RV S prime:     19.40 cm/s TAPSE (M-mode): 2.7 cm LEFT ATRIUM             Index       RIGHT ATRIUM           Index LA diam:        4.30 cm 2.69 cm/m  RA Area:     19.40 cm LA Vol (A2C):   87.4 ml 54.68 ml/m  RA Volume:   48.40 ml  30.28 ml/m LA Vol (A4C):   83.6 ml 52.30 ml/m LA Biplane Vol: 89.6 ml 56.05 ml/m  AORTIC VALVE LVOT Vmax:   117.75 cm/s LVOT Vmean:  77.300 cm/s LVOT VTI:    0.222 m  AORTA Ao Root diam: 2.90 cm Ao Asc diam:  2.50 cm MITRAL VALVE                TRICUSPID VALVE MV Area (PHT): 3.34 cm     TR Peak grad:   48.7 mmHg MV Decel Time: 227 msec     TR Vmax:        349.00 cm/s MV E velocity: 118.00 cm/s MV A velocity: 46.65 cm/s   SHUNTS MV E/A ratio:  2.53         Systemic VTI:  0.22 m                             Systemic Diam: 1.70 cm Donato Schultz MD Electronically signed by Donato Schultz MD Signature Date/Time: 06/16/2020/3:35:22 PM    Final     Pamella Pert, MD,  PhD [facility]  Between 7 am - 7 pm I am available, please contact me via Amion or Securechat  Between 7 pm - 7 am I am not available, please contact night coverage MD/APP via Amion

## 2020-06-17 NOTE — Discharge Summary (Signed)
In most cases prophylactic antibiotics for Dental procdeures after total joint surgery are not necessary.  Exceptions are as follows:  1. History of prior total joint infection  2. Severely immunocompromised (Organ Transplant, cancer chemotherapy, Rheumatoid biologic meds such as Humera)  3. Poorly controlled diabetes (A1C &gt; 8.0, blood glucose over 200)  If you have one of these conditions, contact your surgeon for an antibiotic prescription, prior to your dental procedure. Orthopedic Discharge Summary        Physician Discharge Summary  Patient ID: Melissa Price MRN: [id number] DOB/AGE: 07/27/1950 69 y.o.  Admit date: 06/14/2020 Discharge date: 06/17/2020   Procedures:  Procedure(s) (LRB): TOTAL HIP ARTHROPLASTY ANTERIOR APPROACH (Right)  Attending Physician:  Dr. Malon Kindle  Admission Diagnoses:   Right hip OA end stage  Discharge Diagnoses:  same   Past Medical History:  Diagnosis Date   Arthritis    Atrial fibrillation (HCC)    Complication of anesthesia    Dysrhythmia    afib    GERD (gastroesophageal reflux disease)    History of osteopenia    both hips   Hypertension    PONV (postoperative nausea and vomiting)     PCP: Janean Sark, NP   Discharged Condition: good  Hospital Course:  Patient underwent the above stated procedure on 06/14/2020. Patient tolerated the procedure well and brought to the recovery room in good condition and subsequently to the floor. Post operatively the patient experienced hypotension requiring internal medicine and cardiology evaluation and treatment. The patient received 2 units PRBCs for ABLA which made her feel better. BP meds were held and metoprolol dose was decreased temporarily. Patient cleared by cards and IM for discharge to home today. Has good support from spouse at home.  Disposition: Discharge disposition: 01-Home or Self Care      with follow up in 2 weeks    Follow-up  Information    Swinteck, Arlys John, MD. Schedule an appointment as soon as possible for a visit in 2 weeks.   Specialty: Orthopedic Surgery Why: For wound re-check, For suture removal Contact information: [address] [place] [address] [phone]               Dental Antibiotics:  In most cases prophylactic antibiotics for Dental procdeures after total joint surgery are not necessary.  Exceptions are as follows:  1. History of prior total joint infection  2. Severely immunocompromised (Organ Transplant, cancer chemotherapy, Rheumatoid biologic meds such as Humera)  3. Poorly controlled diabetes (A1C &gt; 8.0, blood glucose over 200)  If you have one of these conditions, contact your surgeon for an antibiotic prescription, prior to your dental procedure.  Discharge Instructions    Call MD / Call 911   Complete by: As directed    If you experience chest pain or shortness of breath, CALL 911 and be transported to the hospital emergency room.  If you develope a fever above 101 F, pus (white drainage) or increased drainage or redness at the wound, or calf pain, call your surgeon's office.   Constipation Prevention   Complete by: As directed    Drink plenty of fluids.  Prune juice may be helpful.  You may use a stool softener, such as Colace (over the counter) 100 mg twice a day.  Use MiraLax (over the counter) for constipation as needed.   Diet - low sodium heart healthy   Complete by: As directed    Increase activity slowly as tolerated   Complete  by: As directed       Allergies as of 06/17/2020      Reactions   Penicillins Shortness Of Breath   amoxicillin      Medication List    TAKE these medications   amLODipine 5 MG tablet Commonly known as: NORVASC Take 5 mg by mouth daily.   atorvastatin 40 MG tablet Commonly known as: LIPITOR Take 40 mg by mouth at bedtime.   docusate sodium 100 MG capsule Commonly known as: COLACE Take 1 capsule  (100 mg total) by mouth 2 (two) times daily.   escitalopram 10 MG tablet Commonly known as: LEXAPRO Take 10 mg by mouth every evening.   fluticasone 50 MCG/ACT nasal spray Commonly known as: FLONASE Place 1 spray into both nostrils daily as needed (allergies.).   HYDROcodone-acetaminophen 5-325 MG tablet Commonly known as: NORCO/VICODIN Take 1 tablet by mouth every 4 (four) hours as needed for moderate pain (pain score 4-6).   metoprolol tartrate 25 MG tablet Commonly known as: LOPRESSOR Take 25 mg by mouth 2 (two) times daily.   montelukast 10 MG tablet Commonly known as: SINGULAIR Take 10 mg by mouth daily as needed (allergies.).   ondansetron 4 MG tablet Commonly known as: ZOFRAN Take 1 tablet (4 mg total) by mouth every 6 (six) hours as needed for nausea.   senna 8.6 MG Tabs tablet Commonly known as: SENOKOT Take 2 tablets (17.2 mg total) by mouth at bedtime.   tetrahydrozoline 0.05 % ophthalmic solution Place 1-2 drops into both eyes 3 (three) times daily as needed (red/irritated eyes.).   Xarelto 20 MG Tabs tablet Generic drug: rivaroxaban Take 20 mg by mouth every evening.         Signed: Verlee Rossetti 06/17/2020, 9:24 AM  [facility] is now [employer] [address], [place], [place] [address] Phone: [phone] Facebook   Instagram   [employer]

## 2020-06-17 NOTE — Plan of Care (Signed)
Patient to be discharged home.  IV removed, cath intact.  BP was 90/60 and asymptomatic; doctor was notified, and report patient was okay to go home.  Mobility equipment and commode chair received at bedside.  Educated patient, all concerns addressed.  To go home, driven/accompanied by husband.

## 2020-06-17 NOTE — Progress Notes (Signed)
°   06/17/20 1334  PT Visit Information  Last PT Received On 06/17/20  Pt just called to go back to bed. Husband present. Reviewed stair handout and technique. Reviewed THA HEP and progression as well as progression of activity. Pt advised to clear LE wt trainign with Dr. Linna Caprice. Reviewed avoiding end range R hip extension as pt does yoga, advised to check with MD also.  CM updated on DME needs for home   Assistance Needed +1  History of Present Illness Patient is 69 y.o. female s/p Rt THA anterior appraoch on 06/14/20 with PMH significant for HTN, osteopenia, GERD, OA, A-fib.  Subjective Data  Patient Stated Goal recover independence  Precautions  Precautions Fall  Restrictions  RLE Weight Bearing WBAT  Pain Assessment  Pain Location Rt hip  Pain Descriptors / Indicators Sore  Cognition  Arousal/Alertness Awake/alert  Behavior During Therapy WFL for tasks assessed/performed  Overall Cognitive Status Within Functional Limits for tasks assessed  Bed Mobility  Overal bed mobility Needs Assistance  Bed Mobility Sit to Supine  Sit to supine Supervision;Min guard  General bed mobility comments pt self assisted Rt LE with cues and incr time  Transfers  Overall transfer level Needs assistance  Equipment used Rolling walker (2 wheeled)  Transfers Sit to/from Stand;Stand Pivot Transfers  Sit to Stand Supervision  Stand pivot transfers Supervision  General transfer comment verbal cues for UE and LE positioning  Balance  Sitting-balance support Feet supported  Sitting balance-Leahy Scale Good  Standing balance support During functional activity;Bilateral upper extremity supported (able to static stand without UE support with supervision)  Standing balance-Leahy Scale Fair  Total Joint Exercises  Ankle Circles/Pumps AROM;Both;20 reps  Quad Sets AROM;Both;10 reps  Short Arc Quad AROM;Right;10 reps  Heel Slides AAROM;Right;10 reps  Hip ABduction/ADduction AAROM;Right;10 reps;Supine  PT  - End of Session  Equipment Utilized During Treatment Gait belt  Activity Tolerance Patient tolerated treatment well  Patient left with call bell/phone within reach;in bed;with bed alarm set  Nurse Communication Mobility status   PT - Assessment/Plan  PT Plan Current plan remains appropriate  PT Visit Diagnosis Muscle weakness (generalized) (M62.81);Difficulty in walking, not elsewhere classified (R26.2)  PT Frequency (ACUTE ONLY) 7X/week  Follow Up Recommendations Follow surgeons recommendation for DC plan and follow-up therapies (HEP)  PT equipment Rolling walker with 5" wheels;3in1 (PT)  AM-PAC PT "6 Clicks" Mobility Outcome Measure (Version 2)  Help needed turning from your back to your side while in a flat bed without using bedrails? 3  Help needed moving from lying on your back to sitting on the side of a flat bed without using bedrails? 3  Help needed moving to and from a bed to a chair (including a wheelchair)? 3  Help needed standing up from a chair using your arms (e.g., wheelchair or bedside chair)? 3  Help needed to walk in hospital room? 3  Help needed climbing 3-5 steps with a railing?  3  6 Click Score 18  Consider Recommendation of Discharge To: Home with [facility]  PT Goal Progression  Progress towards PT goals Progressing toward goals  Acute Rehab PT Goals  PT Goal Formulation With patient  Time For Goal Achievement 06/21/20  Potential to Achieve Goals Good  PT Time Calculation  PT Start Time (ACUTE ONLY) 1224  PT Stop Time (ACUTE ONLY) 1251  PT Time Calculation (min) (ACUTE ONLY) 27 min  PT Treatments  $Therapeutic Exercise 23-37 mins

## 2020-06-17 NOTE — Progress Notes (Signed)
Physical Therapy Treatment Patient Details Name: Melissa Price MRN: [id number] DOB: 09-04-1950 Today's Date: 06/17/2020    History of Present Illness Patient is 69 y.o. female s/p Rt THA anterior appraoch on 06/14/20 with PMH significant for HTN, osteopenia, GERD, OA, A-fib.    PT Comments    Reviewed gait and stairs. Pt is progressing well today, pain well controlled. Should be ready to d/c later today with family assist from PT standpoint    Follow Up Recommendations        Equipment Recommendations  Rolling walker with 5" wheels;3in1 (PT)    Recommendations for Other Services       Precautions / Restrictions Precautions Precautions: Fall Restrictions Weight Bearing Restrictions: No RLE Weight Bearing: Weight bearing as tolerated    Mobility  Bed Mobility                  Transfers Overall transfer level: Needs assistance Equipment used: Rolling walker (2 wheeled) Transfers: Sit to/from Stand Sit to Stand: Min guard         General transfer comment: verbal cues for UE and LE positioning  Ambulation/Gait Ambulation/Gait assistance: Min guard Gait Distance (Feet): 400 Feet Assistive device: Rolling walker (2 wheeled) Gait Pattern/deviations: Step-through pattern;Decreased stride length Gait velocity: decr   General Gait Details: intital cues for RW position and sequence. cues for progression to step through gait. improving fluidity of gait and equal wt shift.   Stairs Stairs: Yes Stairs assistance: Min assist Stair Management: No rails;Backwards;With walker;Step to pattern Number of Stairs: 3 General stair comments: cues for technique and sequence   Wheelchair Mobility    Modified Rankin (Stroke Patients Only)       Balance   Sitting-balance support: Feet supported Sitting balance-Leahy Scale: Good     Standing balance support: During functional activity;Bilateral upper extremity supported (able to static stand without UE support  with supervision) Standing balance-Leahy Scale: Fair                              Cognition Arousal/Alertness: Awake/alert Behavior During Therapy: WFL for tasks assessed/performed Overall Cognitive Status: Within Functional Limits for tasks assessed                                        Exercises      General Comments        Pertinent Vitals/Pain Pain Assessment: 0-10 Pain Score: 3  Pain Location: Rt hip Pain Descriptors / Indicators: Sore Pain Intervention(s): Limited activity within patient's tolerance;Premedicated before session;Monitored during session    Home Living                      Prior Function            PT Goals (current goals can now be found in the care plan section) Acute Rehab PT Goals Patient Stated Goal: recover independence PT Goal Formulation: With patient Time For Goal Achievement: 06/21/20 Potential to Achieve Goals: Good Progress towards PT goals: Progressing toward goals    Frequency    7X/week      PT Plan Current plan remains appropriate    Co-evaluation              AM-PAC PT "6 Clicks" Mobility   Outcome Measure  Help needed turning from your back to your side while in  a flat bed without using bedrails?: A Little Help needed moving from lying on your back to sitting on the side of a flat bed without using bedrails?: A Little Help needed moving to and from a bed to a chair (including a wheelchair)?: A Little Help needed standing up from a chair using your arms (e.g., wheelchair or bedside chair)?: A Little Help needed to walk in hospital room?: A Little Help needed climbing 3-5 steps with a railing? : A Little 6 Click Score: 18    End of Session Equipment Utilized During Treatment: Gait belt Activity Tolerance: Patient tolerated treatment well Patient left: in chair;with call bell/phone within reach;with chair alarm set Nurse Communication: Mobility status PT Visit Diagnosis:  Muscle weakness (generalized) (M62.81);Difficulty in walking, not elsewhere classified (R26.2)     Time: 5170-0174 PT Time Calculation (min) (ACUTE ONLY): 32 min  Charges:  $Gait Training: 23-37 mins                     Delice Bison, PT  Acute Rehab Dept ([facility]) [phone] Pager [phone]  06/17/2020    [facility] 06/17/2020, 1:30 PM

## 2020-06-17 NOTE — TOC Progression Note (Signed)
Transition of Care [facility] - Progression Note    Patient Details  Name: Melissa Price MRN: [id number] Date of Birth: Mar 19, 1951  Transition of Care [facility]) CM/SW Contact  Armanda Heritage, RN Phone Number: 06/17/2020, 1:01 PM  Clinical Narrative:    [employer] notified to deliver rolling walker and 3in1.    Barriers to Discharge: Continued Medical Work up  Expected Discharge Plan and Services           Expected Discharge Date: 06/17/20               DME Arranged: Dan Humphreys rolling,3-N-1 DME Agency: [employer] Date DME Agency Contacted: 06/17/20 Time DME Agency Contacted: [phone] Representative spoke with at DME Agency: Lucrecia             Social Determinants of Health (SDOH) Interventions    Readmission Risk Interventions No flowsheet data found.

## 2020-06-17 NOTE — Progress Notes (Signed)
Progress Note  Patient Name: Melissa Price Date of Encounter: 06/17/2020  [facility] Cardiologist: Daryel November, MD   Subjective   No further bradycardia.  Had a run of nonsustained atrial tach and frequent PVCs on tele  Inpatient Medications    Scheduled Meds: . sodium chloride   Intravenous Once  . atorvastatin  40 mg Oral QHS  . docusate sodium  100 mg Oral BID  . escitalopram  10 mg Oral QPM  . metoprolol tartrate  12.5 mg Oral BID  . pantoprazole  40 mg Oral BID AC  . senna  2 tablet Oral QHS   Continuous Infusions: . magnesium sulfate bolus IVPB    . methocarbamol (ROBAXIN) IV Stopped (06/14/20 1349)   PRN Meds: acetaminophen, alum & mag hydroxide-simeth, diphenhydrAMINE, fluticasone, HYDROcodone-acetaminophen, HYDROcodone-acetaminophen, menthol-cetylpyridinium **OR** phenol, methocarbamol **OR** methocarbamol (ROBAXIN) IV, metoCLOPramide **OR** metoCLOPramide (REGLAN) injection, montelukast, morphine injection, naphazoline-glycerin, ondansetron **OR** ondansetron (ZOFRAN) IV, polyethylene glycol   Vital Signs    Vitals:   06/16/20 1511 06/16/20 1514 06/16/20 1948 06/17/20 0408  BP:  (!) 109/57 115/61 115/67  Pulse:  81 (!) 50 66  Resp:  18 20 18   Temp: 100.3 F (37.9 C) 98.3 F (36.8 C) 98.7 F (37.1 C) 98.7 F (37.1 C)  TempSrc:  Oral Oral Oral  SpO2:  97% 96% 97%  Weight:      Height:        Intake/Output Summary (Last 24 hours) at 06/17/2020 0757 Last data filed at 06/17/2020 0341 Gross per 24 hour  Intake 300 ml  Output 1800 ml  Net -1500 ml   Last 3 Weights 06/14/2020 06/14/2020  Weight (lbs) 127 lb 12.8 oz 127 lb 12.8 oz  Weight (kg) 57.97 kg 57.97 kg      Telemetry    NSR with nonsustained atrial tachycardia and PVCs - Personally Reviewed  ECG    No new EKG to review - Personally Reviewed  Physical Exam   GEN: No acute distress.   Neck: No JVD Cardiac: RRR, no murmurs, rubs, or gallops.  Respiratory: Clear to auscultation  bilaterally. GI: Soft, nontender, non-distended  MS: No edema; No deformity. Neuro:  Nonfocal  Psych: Normal affect   Labs    High Sensitivity Troponin:  No results for input(s): TROPONINIHS in the last 720 hours.    Chemistry Recent Labs  Lab 06/15/20 0338 06/16/20 0447 06/17/20 0531  NA 134* 127* 136  K 4.4 4.5 4.2  CL 103 99 104  CO2 22 21* 24  GLUCOSE 218* 137* 99  BUN 19 18 11   CREATININE 0.84 0.72 0.77  CALCIUM 8.6* 8.4* 8.8*  PROT  --  5.2* 5.3*  ALBUMIN  --  3.2* 3.1*  AST  --  34 28  ALT  --  18 18  ALKPHOS  --  40 42  BILITOT  --  0.4 0.7  GFRNONAA >60 >60 >60  ANIONGAP 9 7 8      Hematology Recent Labs  Lab 06/15/20 0338 06/15/20 1040 06/15/20 2104 06/16/20 0447 06/17/20 0531  WBC 9.4  --   --  8.5 7.2  RBC 2.14*  --   --  2.48* 2.92*  HGB 7.0*   < > 8.3* 7.9* 9.3*  HCT 22.1*   < > 25.4* 23.8* 27.8*  MCV 103.3*  --   --  96.0 95.2  MCH 32.7  --   --  31.9 31.8  MCHC 31.7  --   --  33.2 33.5  RDW 12.9  --   --  15.8* 15.9*  PLT 150  --   --  133* 143*   < > = values in this interval not displayed.    BNPNo results for input(s): BNP, PROBNP in the last 168 hours.   DDimer No results for input(s): DDIMER in the last 168 hours.     Radiology    ECHOCARDIOGRAM COMPLETE  Result Date: 06/16/2020    ECHOCARDIOGRAM REPORT   Patient Name:   Melissa Price Date of Exam: 06/16/2020 Medical Rec #:  [id number]       Height:       63.0 in Accession #:    [id number]      Weight:       127.8 lb Date of Birth:  Aug 10, 1950      BSA:          1.599 m Patient Age:    69 years        BP:           125/64 mmHg Patient Gender: F               HR:           80 bpm. Exam Location:  Inpatient Procedure: 2D Echo, Cardiac Doppler and Color Doppler Indications:    R94.31 Abnormal EKG  History:        Patient has no prior history of Echocardiogram examinations.                 Arrythmias:Atrial Fibrillation; Risk Factors:Hypertension and                 GERD.   Sonographer:    Elmarie Shiley Dance Referring Phys: [id number] Daylene Katayama GHERGHE IMPRESSIONS  1. Left ventricular ejection fraction, by estimation, is 60 to 65%. The left ventricle has normal function. The left ventricle has no regional wall motion abnormalities. Left ventricular diastolic parameters were normal.  2. Right ventricular systolic function is normal. The right ventricular size is mildly enlarged. There is moderately elevated pulmonary artery systolic pressure. The estimated right ventricular systolic pressure is 56.7 mmHg.  3. Left atrial size was severely dilated.  4. Right atrial size was severely dilated.  5. The pericardial effusion is circumferential. There is no evidence of cardiac tamponade.  6. The mitral valve is normal in structure. Mild mitral valve regurgitation. No evidence of mitral stenosis.  7. The aortic valve is tricuspid. Aortic valve regurgitation is not visualized. Mild aortic valve sclerosis is present, with no evidence of aortic valve stenosis.  8. The inferior vena cava is dilated in size with <50% respiratory variability, suggesting right atrial pressure of 15 mmHg. FINDINGS  Left Ventricle: Left ventricular ejection fraction, by estimation, is 60 to 65%. The left ventricle has normal function. The left ventricle has no regional wall motion abnormalities. The left ventricular internal cavity size was normal in size. There is  no left ventricular hypertrophy. Left ventricular diastolic parameters were normal. Right Ventricle: The right ventricular size is mildly enlarged. No increase in right ventricular wall thickness. Right ventricular systolic function is normal. There is moderately elevated pulmonary artery systolic pressure. The tricuspid regurgitant velocity is 3.49 m/s, and with an assumed right atrial pressure of 8 mmHg, the estimated right ventricular systolic pressure is 56.7 mmHg. Left Atrium: Left atrial size was severely dilated. Right Atrium: Right atrial size was severely  dilated. Pericardium: Trivial pericardial effusion is present. The pericardial effusion is circumferential. There is no evidence of cardiac tamponade. Mitral Valve: The mitral valve is normal  in structure. Mild mitral valve regurgitation. No evidence of mitral valve stenosis. Tricuspid Valve: The tricuspid valve is normal in structure. Tricuspid valve regurgitation is mild . No evidence of tricuspid stenosis. Aortic Valve: The aortic valve is tricuspid. Aortic valve regurgitation is not visualized. Mild aortic valve sclerosis is present, with no evidence of aortic valve stenosis. Pulmonic Valve: The pulmonic valve was normal in structure. Pulmonic valve regurgitation is not visualized. No evidence of pulmonic stenosis. Aorta: The aortic root is normal in size and structure. Venous: The inferior vena cava is dilated in size with less than 50% respiratory variability, suggesting right atrial pressure of 15 mmHg. IAS/Shunts: No atrial level shunt detected by color flow Doppler.  LEFT VENTRICLE PLAX 2D LVIDd:         4.60 cm  Diastology LVIDs:         2.70 cm  LV e' medial:    10.80 cm/s LV PW:         1.20 cm  LV E/e' medial:  10.9 LV IVS:        0.80 cm  LV e' lateral:   12.60 cm/s LVOT diam:     1.70 cm  LV E/e' lateral: 9.4 LV SV:         50 LV SV Index:   32 LVOT Area:     2.27 cm  RIGHT VENTRICLE             IVC RV Basal diam:  2.60 cm     IVC diam: 2.50 cm RV S prime:     19.40 cm/s TAPSE (M-mode): 2.7 cm LEFT ATRIUM             Index       RIGHT ATRIUM           Index LA diam:        4.30 cm 2.69 cm/m  RA Area:     19.40 cm LA Vol (A2C):   87.4 ml 54.68 ml/m RA Volume:   48.40 ml  30.28 ml/m LA Vol (A4C):   83.6 ml 52.30 ml/m LA Biplane Vol: 89.6 ml 56.05 ml/m  AORTIC VALVE LVOT Vmax:   117.75 cm/s LVOT Vmean:  77.300 cm/s LVOT VTI:    0.222 m  AORTA Ao Root diam: 2.90 cm Ao Asc diam:  2.50 cm MITRAL VALVE                TRICUSPID VALVE MV Area (PHT): 3.34 cm     TR Peak grad:   48.7 mmHg MV Decel Time:  227 msec     TR Vmax:        349.00 cm/s MV E velocity: 118.00 cm/s MV A velocity: 46.65 cm/s   SHUNTS MV E/A ratio:  2.53         Systemic VTI:  0.22 m                             Systemic Diam: 1.70 cm Donato Schultz MD Electronically signed by Donato Schultz MD Signature Date/Time: 06/16/2020/3:35:22 PM    Final     Cardiac Studies   2D echo 06/17/2020 IMPRESSIONS    1. Left ventricular ejection fraction, by estimation, is 60 to 65%. The  left ventricle has normal function. The left ventricle has no regional  wall motion abnormalities. Left ventricular diastolic parameters were  normal.  2. Right ventricular systolic function is normal. The right ventricular  size is  mildly enlarged. There is moderately elevated pulmonary artery  systolic pressure. The estimated right ventricular systolic pressure is  56.7 mmHg.  3. Left atrial size was severely dilated.  4. Right atrial size was severely dilated.  5. The pericardial effusion is circumferential. There is no evidence of  cardiac tamponade.  6. The mitral valve is normal in structure. Mild mitral valve  regurgitation. No evidence of mitral stenosis.  7. The aortic valve is tricuspid. Aortic valve regurgitation is not  visualized. Mild aortic valve sclerosis is present, with no evidence of  aortic valve stenosis.  8. The inferior vena cava is dilated in size with <50% respiratory  variability, suggesting right atrial pressure of 15 mmHg.   Patient Profile     69 y.o. female with a PMH of paroxysmal atrial fibrillation, HTN, HLD, GI bleed 2/2 NSAID use, and osteoarthritis s/p hip replacement 06/14/20, who is being seen today for the evaluation of bradycardia, ventricular bigeminy, and intermittent tachycardia at the request of Dr. Elvera LennoxGherghe.  Assessment & Plan    1. Bradycardia/bigeminy: patient noted to have HR in the 30s following hip replacement surgery 06/14/20. She was hypotensive with BP 80s/40s at the time but reported to be  asymptomatic. She was given IVFs with improvement in BP and home BBlocker was held. She was placed on telemetry without recurrence. She has had persistent bigeminy/trigeminy this admission though is not terribly symptomatic from this. No symptoms to suggest symptomatic bradycardia. Suspect her episode was a reaction to anesthesia - low dose metoprolol started yesterday and no further bradycardia - sill having frequent PVCs on tele - BP stable with no further hypotension - increase Lopressor to home dose of 25mg  BID  2. Paroxysmal atrial fibrillation: predominate rhythm this admission is bigeminy/trigeminy with several short episodes of atrial tachycardia. Home xarelto held in preparation for surgery. She has had post-op anemia requiring 2 uPRBC this admission.  - Would restart xarelto when cleared to do so by her surgeon - resume home dose of BB  3. HTN: BP soft in the post-op setting. Home amlodipine and metoprolol tartrate on held -restarted on lopressor home dose - Continue to monitor closely and reintroduce home amlodipine as needed  4. HLD: no lipids on file - Continue statin  For questions or updates, please contact [facility] Please consult [email or web address] for contact info under        Signed, Armanda Magicraci Krystelle Prashad, MD [facility] 06/17/2020

## 2020-06-17 NOTE — Discharge Instructions (Signed)
° °Dr. Brian Swinteck °Joint Replacement Specialist °[facility] °[address] °[place],  [address] °[phone] ° ° °TOTAL HIP REPLACEMENT POSTOPERATIVE DIRECTIONS ° ° ° °Hip Rehabilitation, Guidelines Following Surgery  ° °WEIGHT BEARING °Weight bearing as tolerated with assist device (walker, cane, etc) as directed, use it as long as suggested by your surgeon or therapist, typically at least 4-6 weeks. ° °The results of a hip operation are greatly improved after range of motion and muscle strengthening exercises. Follow all safety measures which are given to protect your hip. If any of these exercises cause increased pain or swelling in your joint, decrease the amount until you are comfortable again. Then slowly increase the exercises. Call your caregiver if you have problems or questions.  ° °HOME CARE INSTRUCTIONS  °Most of the following instructions are designed to prevent the dislocation of your new hip.  °Remove items at home which could result in a fall. This includes throw rugs or furniture in walking pathways.  °Continue medications as instructed at time of discharge. °· You may have some home medications which will be placed on hold until you complete the course of blood thinner medication. °· You may start showering once you are discharged home. Do not remove your dressing. °Do not put on socks or shoes without following the instructions of your caregivers.   °Sit on chairs with arms. Use the chair arms to help push yourself up when arising.  °Arrange for the use of a toilet seat elevator so you are not sitting low.  °· Walk with walker as instructed.  °You may resume a sexual relationship in one month or when given the OK by your caregiver.  °Use walker as long as suggested by your caregivers.  °You may put full weight on your legs and walk as much as is comfortable. °Avoid periods of inactivity such as sitting longer than an hour when not asleep. This helps prevent  blood clots.  °You may return to work once you are cleared by your surgeon.  °Do not drive a car for 6 weeks or until released by your surgeon.  °Do not drive while taking narcotics.  °Wear elastic stockings for two weeks following surgery during the day but you may remove then at night.  °Make sure you keep all of your appointments after your operation with all of your doctors and caregivers. You should call the office at the above phone number and make an appointment for approximately two weeks after the date of your surgery. °Please pick up a stool softener and laxative for home use as long as you are requiring pain medications. °· ICE to the affected hip every three hours for 30 minutes at a time and then as needed for pain and swelling. Continue to use ice on the hip for pain and swelling from surgery. You may notice swelling that will progress down to the foot and ankle.  This is normal after surgery.  Elevate the leg when you are not up walking on it.   °It is important for you to complete the blood thinner medication as prescribed by your doctor. °· Continue to use the breathing machine which will help keep your temperature down.  It is common for your temperature to cycle up and down following surgery, especially at night when you are not up moving around and exerting yourself.  The breathing machine keeps your lungs expanded and your temperature down. ° °RANGE OF MOTION AND STRENGTHENING EXERCISES  °These exercises   are designed to help you keep full movement of your hip joint. Follow your caregiver's or physical therapist's instructions. Perform all exercises about fifteen times, three times per day or as directed. Exercise both hips, even if you have had only one joint replacement. These exercises can be done on a training (exercise) mat, on the floor, on a table or on a bed. Use whatever works the best and is most comfortable for you. Use music or television while you are exercising so that the exercises  are a pleasant break in your day. This will make your life better with the exercises acting as a break in routine you can look forward to.  Lying on your back, slowly slide your foot toward your buttocks, raising your knee up off the floor. Then slowly slide your foot back down until your leg is straight again.  Lying on your back spread your legs as far apart as you can without causing discomfort.  Lying on your side, raise your upper leg and foot straight up from the floor as far as is comfortable. Slowly lower the leg and repeat.  Lying on your back, tighten up the muscle in the front of your thigh (quadriceps muscles). You can do this by keeping your leg straight and trying to raise your heel off the floor. This helps strengthen the largest muscle supporting your knee.  Lying on your back, tighten up the muscles of your buttocks both with the legs straight and with the knee bent at a comfortable angle while keeping your heel on the floor.   SKILLED REHAB INSTRUCTIONS: If the patient is transferred to a skilled rehab facility following release from the hospital, a list of the current medications will be sent to the facility for the patient to continue.  When discharged from the skilled rehab facility, please have the facility set up the patient's Home Health Physical Therapy prior to being released. Also, the skilled facility will be responsible for providing the patient with their medications at time of release from the facility to include their pain medication and their blood thinner medication. If the patient is still at the rehab facility at time of the two week follow up appointment, the skilled rehab facility will also need to assist the patient in arranging follow up appointment in our office and any transportation needs.  MAKE SURE YOU:  Understand these instructions.  Will watch your condition.  Will get help right away if you are not doing well or get worse.  Pick up stool softner and  laxative for home use following surgery while on pain medications. Do not remove your dressing. The dressing is waterproof--it is OK to take showers. Continue to use ice for pain and swelling after surgery. Do not use any lotions or creams on the incision until instructed by your surgeon. Total Hip Protocol.  RESTART Xarelto this evening 06/17/20

## 2020-06-17 NOTE — Progress Notes (Signed)
Orthopedics Progress Note  Subjective: Patient comfortable this AM. No complaints  Objective:  Vitals:   06/16/20 1948 06/17/20 0408  BP: 115/61 115/67  Pulse: (!) 50 66  Resp: 20 18  Temp: 98.7 F (37.1 C) 98.7 F (37.1 C)  SpO2: 96% 97%    General: Awake and alert  Musculoskeletal: Right hip dressing in place, no drainage and minimal swelling Neurovascularly intact  Lab Results  Component Value Date   WBC 7.2 06/17/2020   HGB 9.3 (L) 06/17/2020   HCT 27.8 (L) 06/17/2020   MCV 95.2 06/17/2020   PLT 143 (L) 06/17/2020       Component Value Date/Time   NA 136 06/17/2020 0531   K 4.2 06/17/2020 0531   CL 104 06/17/2020 0531   CO2 24 06/17/2020 0531   GLUCOSE 99 06/17/2020 0531   BUN 11 06/17/2020 0531   CREATININE 0.77 06/17/2020 0531   CALCIUM 8.8 (L) 06/17/2020 0531   GFRNONAA >60 06/17/2020 0531    Lab Results  Component Value Date   INR 1.3 (H) 06/06/2020    Assessment/Plan: POD #2 s/p Procedure(s): TOTAL HIP ARTHROPLASTY ANTERIOR APPROACH Feeling better today. Seen by Cardiology and IM. Labs look good Still anemic but patient prefers not to take supplemental iron as it makes her feel poorly. Patient should be cleared for discharge home after therapy.  Follow up in two weeks  Almedia Balls. Ranell Patrick, MD 06/17/2020 9:18 AM

## 2021-07-30 ENCOUNTER — Ambulatory Visit: Payer: Self-pay | Admitting: Orthopedic Surgery

## 2021-08-09 NOTE — Progress Notes (Addendum)
Anesthesia Review:  PCP: Grayland Jack ,NP Cardiologist : DR Daryel November in [place]  PT states she saw DR Daryel November for clearance end of January.  Called and [facility] and requested LOV , 12 lead ekg tracing and stress test.   Clearance dated 07/02/21. By Judith Part  LOV 08/30/20 on chart  Chest x2/6/23 -ray : EKG : 04/18/21 on chart  Echo : 12/10//21  Stress test:07/19/2021- on chart  Cardiac Cath :  Activity level: can do a flgiht of stairs without difficulty  Sleep Study/ CPAP : none  Fasting Blood Sugar :      / Checks Blood Sugar -- times a day:   Blood Thinner/ Instructions /Last Dose: ASA / Instructions/ Last Dose :  Xarelto  - stop 2 days prior per pt  Covid test on 08/20/21 at 0900am

## 2021-08-09 NOTE — Progress Notes (Signed)
Covid test on 08/20/21  Come thru main entrance at [facility].  Have a seat in the lobby on the right as you come thru the door.  Call [phone] and let them know you are here for covid testing.                  Your procedure is scheduled on:            08/23/2021.   Report to [facility] Main  Entrance   Report to admitting at    0515am      Call this number if you have problems the morning of surgery [phone]    REMEMBER: NO  SOLID FOOD CANDY OR GUM AFTER MIDNIGHT. CLEAR LIQUIDS UNTIL  0430am        . NOTHING BY MOUTH EXCEPT CLEAR LIQUIDS UNTIL 0430am    . PLEASE FINISH ENSURE DRINK PER SURGEON ORDER  WHICH NEEDS TO BE COMPLETED AT    0430am   .      CLEAR LIQUID DIET   Foods Allowed                                                                    Coffee and tea, regular and decaf                            Fruit ices (not with fruit pulp)                                      Iced Popsicles                                    Carbonated beverages, regular and diet                                    Cranberry, grape and apple juices Sports drinks like Gatorade Lightly seasoned clear broth or consume(fat free) Sugar, honey syrup ___________________________________________________________________      BRUSH YOUR TEETH MORNING OF SURGERY AND RINSE YOUR MOUTH OUT, NO CHEWING GUM CANDY OR MINTS.     Take these medicines the morning of surgery with A SIP OF WATER:  eye drops as usual amlodipine, claritin   DO NOT TAKE ANY DIABETIC MEDICATIONS DAY OF YOUR SURGERY                               You may not have any metal on your body including hair pins and              piercings  Do not wear jewelry, make-up, lotions, powders or perfumes, deodorant             Do not wear nail polish on your fingernails.  Do not shave  48 hours prior to surgery.              Men may shave face and neck.   Do  not bring valuables to the hospital. [facility] IS NOT              RESPONSIBLE   FOR VALUABLES.  Contacts, dentures or bridgework may not be worn into surgery.  Leave suitcase in the car. After surgery it may be brought to your room.     Patients discharged the day of surgery will not be allowed to drive home. IF YOU ARE HAVING SURGERY AND GOING HOME THE SAME DAY, YOU MUST HAVE AN ADULT TO DRIVE YOU HOME AND BE WITH YOU FOR 24 HOURS. YOU MAY GO HOME BY TAXI OR UBER OR ORTHERWISE, BUT AN ADULT MUST ACCOMPANY YOU HOME AND STAY WITH YOU FOR 24 HOURS.  Name and phone number of your driver:  Special Instructions: N/A              Please read over the following fact sheets you were given: _____________________________________________________________________  [facility] - Preparing for Surgery Before surgery, you can play an important role.  Because skin is not sterile, your skin needs to be as free of germs as possible.  You can reduce the number of germs on your skin by washing with CHG (chlorahexidine gluconate) soap before surgery.  CHG is an antiseptic cleaner which kills germs and bonds with the skin to continue killing germs even after washing. Please DO NOT use if you have an allergy to CHG or antibacterial soaps.  If your skin becomes reddened/irritated stop using the CHG and inform your nurse when you arrive at Short Stay. Do not shave (including legs and underarms) for at least 48 hours prior to the first CHG shower.  You may shave your face/neck. Please follow these instructions carefully:  1.  Shower with CHG Soap the night before surgery and the  morning of Surgery.  2.  If you choose to wash your hair, wash your hair first as usual with your  normal  shampoo.  3.  After you shampoo, rinse your hair and body thoroughly to remove the  shampoo.                           4.  Use CHG as you would any other liquid soap.  You can apply chg directly  to the skin and wash                       Gently with a scrungie or clean washcloth.  5.  Apply the CHG Soap  to your body ONLY FROM THE NECK DOWN.   Do not use on face/ open                           Wound or open sores. Avoid contact with eyes, ears mouth and genitals (private parts).                       Wash face,  Genitals (private parts) with your normal soap.             6.  Wash thoroughly, paying special attention to the area where your surgery  will be performed.  7.  Thoroughly rinse your body with warm water from the neck down.  8.  DO NOT shower/wash with your normal soap after using and rinsing off  the CHG Soap.  9.  Pat yourself dry with a clean towel.            10.  Wear clean pajamas.            11.  Place clean sheets on your bed the night of your first shower and do not  sleep with pets. Day of Surgery : Do not apply any lotions/deodorants the morning of surgery.  Please wear clean clothes to the hospital/surgery center.  FAILURE TO FOLLOW THESE INSTRUCTIONS MAY RESULT IN THE CANCELLATION OF YOUR SURGERY PATIENT SIGNATURE_________________________________  NURSE SIGNATURE__________________________________  ________________________________________________________________________

## 2021-08-13 ENCOUNTER — Other Ambulatory Visit: Payer: Self-pay

## 2021-08-13 ENCOUNTER — Encounter (HOSPITAL_COMMUNITY)
Admission: RE | Admit: 2021-08-13 | Discharge: 2021-08-13 | Disposition: A | Discharge status: Home / Self Care | Payer: Medicare PPO | Source: Ambulatory Visit | Attending: Orthopedic Surgery | Admitting: Orthopedic Surgery

## 2021-08-13 ENCOUNTER — Encounter (HOSPITAL_COMMUNITY): Payer: Self-pay

## 2021-08-13 VITALS — BP 121/77 | HR 57 | Temp 98.4°F | Resp 16 | Ht 63.0 in | Wt 136.0 lb

## 2021-08-13 DIAGNOSIS — Z01812 Encounter for preprocedural laboratory examination: Secondary | ICD-10-CM | POA: Insufficient documentation

## 2021-08-13 DIAGNOSIS — I495 Sick sinus syndrome: Secondary | ICD-10-CM | POA: Diagnosis not present

## 2021-08-13 DIAGNOSIS — Z01818 Encounter for other preprocedural examination: Secondary | ICD-10-CM

## 2021-08-13 LAB — BASIC METABOLIC PANEL
Anion gap: 7 (ref 5–15)
BUN: 17 mg/dL (ref 8–23)
CO2: 31 mmol/L (ref 22–32)
Calcium: 9.9 mg/dL (ref 8.9–10.3)
Chloride: 104 mmol/L (ref 98–111)
Creatinine, Ser: 0.83 mg/dL (ref 0.44–1.00)
GFR, Estimated: >60 mL/min (ref >60)
Glucose, Bld: 108 mg/dL — ABNORMAL HIGH (ref 70–99)
Potassium: 4.3 mmol/L (ref 3.5–5.1)
Sodium: 142 mmol/L (ref 135–145)

## 2021-08-13 LAB — CBC
HCT: 40 % (ref 36.0–46.0)
Hemoglobin: 13.1 g/dL (ref 12.0–15.0)
MCH: 32.8 pg (ref 26.0–34.0)
MCHC: 32.8 g/dL (ref 30.0–36.0)
MCV: 100 fL (ref 80.0–100.0)
Platelets: 255 10*3/uL (ref 150–400)
RBC: 4 MIL/uL (ref 3.87–5.11)
RDW: 12.5 % (ref 11.5–15.5)
WBC: 5.8 10*3/uL (ref 4.0–10.5)
nRBC: 0 % (ref 0.0–0.2)

## 2021-08-13 LAB — SURGICAL PCR SCREEN
MRSA, PCR: NEGATIVE
Staphylococcus aureus: NEGATIVE

## 2021-08-16 NOTE — Progress Notes (Signed)
Anesthesia Chart Review   Case: [id number] Date/Time: 08/23/21 1015   Procedure: TOTAL HIP ARTHROPLASTY ANTERIOR APPROACH (Left: Hip) - 150   Anesthesia type: Spinal   Pre-op diagnosis: Left hip osteoarthritis   Location: [facility] ROOM 08 / [facility]   Surgeons: Samson Frederic, MD       DISCUSSION:70 y.o. never smoker with h/o HTN, atrial fibrillation (on Xarelto), left hip OA scheduled for above procedure 08/23/2021 with Dr. Samson Frederic.   Clearance received from Cardiologist which states pt is moderate risk for planned procedure and can hold Xarelto 3 days prior to surgery.   Negative stress test 07/19/2021  Anticipate pt can proceed with planned procedure barring acute status change.   VS: BP 121/77    Pulse (!) 57    Temp 36.9 C (Oral)    Resp 16    Ht 5\' 3"  (1.6 m)    Wt 61.7 kg    SpO2 98%    BMI 24.09 kg/m   PROVIDERS: , NP is PCP   Janean Sark, MD is Cardiologist  LABS: Labs reviewed: Acceptable for surgery. (all labs ordered are listed, but only abnormal results are displayed)  Labs Reviewed  BASIC METABOLIC PANEL - Abnormal; Notable for the following components:      Result Value   Glucose, Bld 108 (*)    All other components within normal limits  SURGICAL PCR SCREEN  CBC     IMAGES:   EKG: On chart   CV: Stress Test 07/19/2021 Impression The perfusion study is nomrla. No perfusion defects seen on imaging The patient experienced SOB No stress induced arrhythmias Physiologic blood pressure response No infusion induced ECG changes SPECT nuclear imaging demonstrates no evidence of ischemia No evidence of infarct Normal left ventricular function Calculated LVEF: 67%  Echo 06/16/2020 1. Left ventricular ejection fraction, by estimation, is 60 to 65%. The  left ventricle has normal function. The left ventricle has no regional  wall motion abnormalities. Left ventricular diastolic parameters were  normal.   2. Right ventricular systolic  function is normal. The right ventricular  size is mildly enlarged. There is moderately elevated pulmonary artery  systolic pressure. The estimated right ventricular systolic pressure is  56.7 mmHg.   3. Left atrial size was severely dilated.   4. Right atrial size was severely dilated.   5. The pericardial effusion is circumferential. There is no evidence of  cardiac tamponade.   6. The mitral valve is normal in structure. Mild mitral valve  regurgitation. No evidence of mitral stenosis.   7. The aortic valve is tricuspid. Aortic valve regurgitation is not  visualized. Mild aortic valve sclerosis is present, with no evidence of  aortic valve stenosis.   8. The inferior vena cava is dilated in size with <50% respiratory  variability, suggesting right atrial pressure of 15 mmHg.  Past Medical History:  Diagnosis Date   Arthritis    Atrial fibrillation (HCC)    Complication of anesthesia    Dysrhythmia    afib    History of osteopenia    both hips   Hypertension     Past Surgical History:  Procedure Laterality Date   KNEE SURGERY Right 2000   Dr. 2001 - Arthroscopic   TOTAL HIP ARTHROPLASTY Right 06/14/2020   Procedure: TOTAL HIP ARTHROPLASTY ANTERIOR APPROACH;  Surgeon: 14/02/2020, MD;  Location: [facility];  Service: Orthopedics;  Laterality: Right;    MEDICATIONS:  amLODipine (NORVASC) 5 MG tablet   atorvastatin (LIPITOR)  40 MG tablet   docusate sodium (COLACE) 100 MG capsule   escitalopram (LEXAPRO) 10 MG tablet   fluticasone (FLONASE) 50 MCG/ACT nasal spray   HYDROcodone-acetaminophen (NORCO/VICODIN) 5-325 MG tablet   loratadine (CLARITIN) 10 MG tablet   metoprolol tartrate (LOPRESSOR) 25 MG tablet   olopatadine (PATANOL) 0.1 % ophthalmic solution   ondansetron (ZOFRAN) 4 MG tablet   rivaroxaban (XARELTO) 20 MG TABS tablet   senna (SENOKOT) 8.6 MG TABS tablet   tetrahydrozoline 0.05 % ophthalmic solution   No current facility-administered medications for this  encounter.    Jodell Cipro Ward, PA-C [facility] Pre-Surgical Testing [phone]

## 2021-08-20 ENCOUNTER — Other Ambulatory Visit: Payer: Self-pay

## 2021-08-20 ENCOUNTER — Encounter (HOSPITAL_COMMUNITY)
Admission: RE | Admit: 2021-08-20 | Discharge: 2021-08-20 | Disposition: A | Discharge status: Home / Self Care | Payer: Medicare PPO | Source: Ambulatory Visit | Attending: Orthopedic Surgery | Admitting: Orthopedic Surgery

## 2021-08-20 DIAGNOSIS — Z1152 Encounter for screening for COVID-19: Secondary | ICD-10-CM

## 2021-08-20 DIAGNOSIS — Z01812 Encounter for preprocedural laboratory examination: Secondary | ICD-10-CM | POA: Insufficient documentation

## 2021-08-20 DIAGNOSIS — Z20822 Contact with and (suspected) exposure to covid-19: Secondary | ICD-10-CM | POA: Diagnosis not present

## 2021-08-20 LAB — SARS CORONAVIRUS 2 (TAT 6-24 HRS): SARS Coronavirus 2: NEGATIVE

## 2021-08-23 ENCOUNTER — Ambulatory Visit (HOSPITAL_COMMUNITY): Payer: Medicare PPO

## 2021-08-23 ENCOUNTER — Ambulatory Visit (HOSPITAL_COMMUNITY): Payer: Medicare PPO | Admitting: Physician Assistant

## 2021-08-23 ENCOUNTER — Encounter (HOSPITAL_COMMUNITY)
Admission: RE | Disposition: A | Discharge status: Home / Self Care | Payer: Self-pay | Source: Home / Self Care | Attending: Orthopedic Surgery

## 2021-08-23 ENCOUNTER — Ambulatory Visit (HOSPITAL_BASED_OUTPATIENT_CLINIC_OR_DEPARTMENT_OTHER): Payer: Medicare PPO | Admitting: Anesthesiology

## 2021-08-23 ENCOUNTER — Other Ambulatory Visit: Payer: Self-pay

## 2021-08-23 ENCOUNTER — Encounter (HOSPITAL_COMMUNITY): Payer: Self-pay | Admitting: Orthopedic Surgery

## 2021-08-23 ENCOUNTER — Ambulatory Visit (HOSPITAL_COMMUNITY)
Admission: RE | Admit: 2021-08-23 | Discharge: 2021-08-24 | Disposition: A | Discharge status: Home / Self Care | Payer: Medicare PPO | Attending: Orthopedic Surgery | Admitting: Orthopedic Surgery

## 2021-08-23 DIAGNOSIS — M1612 Unilateral primary osteoarthritis, left hip: Secondary | ICD-10-CM | POA: Diagnosis present

## 2021-08-23 DIAGNOSIS — I10 Essential (primary) hypertension: Secondary | ICD-10-CM

## 2021-08-23 DIAGNOSIS — Z7901 Long term (current) use of anticoagulants: Secondary | ICD-10-CM | POA: Insufficient documentation

## 2021-08-23 DIAGNOSIS — R2681 Unsteadiness on feet: Secondary | ICD-10-CM | POA: Insufficient documentation

## 2021-08-23 DIAGNOSIS — I4891 Unspecified atrial fibrillation: Secondary | ICD-10-CM

## 2021-08-23 DIAGNOSIS — Z79899 Other long term (current) drug therapy: Secondary | ICD-10-CM | POA: Diagnosis not present

## 2021-08-23 DIAGNOSIS — I080 Rheumatic disorders of both mitral and aortic valves: Secondary | ICD-10-CM | POA: Insufficient documentation

## 2021-08-23 DIAGNOSIS — Z09 Encounter for follow-up examination after completed treatment for conditions other than malignant neoplasm: Secondary | ICD-10-CM

## 2021-08-23 DIAGNOSIS — Z419 Encounter for procedure for purposes other than remedying health state, unspecified: Secondary | ICD-10-CM

## 2021-08-23 HISTORY — PX: TOTAL HIP ARTHROPLASTY: SHX124

## 2021-08-23 SURGERY — ARTHROPLASTY, HIP, TOTAL, ANTERIOR APPROACH
Anesthesia: General | Site: Hip | Laterality: Left

## 2021-08-23 MED ORDER — DIPHENHYDRAMINE HCL 12.5 MG/5ML PO ELIX: 12.5000 mg | ORAL_SOLUTION | ORAL | Status: DC | PRN

## 2021-08-23 MED ORDER — KETOROLAC TROMETHAMINE 30 MG/ML IJ SOLN
INTRAMUSCULAR | Status: AC
  Filled 2021-08-23: qty 1

## 2021-08-23 MED ORDER — OLOPATADINE HCL 0.1 % OP SOLN
1.0000 [drp] | Freq: Two times a day (BID) | OPHTHALMIC | Status: DC
  Administered 2021-08-23 – 2021-08-24 (×2): 1 [drp] via OPHTHALMIC
  Filled 2021-08-23: qty 5

## 2021-08-23 MED ORDER — FENTANYL CITRATE (PF) 100 MCG/2ML IJ SOLN
INTRAMUSCULAR | Status: AC
  Filled 2021-08-23: qty 2

## 2021-08-23 MED ORDER — RIVAROXABAN 10 MG PO TABS
10.0000 mg | ORAL_TABLET | Freq: Every day | ORAL | Status: DC
  Administered 2021-08-24: 10 mg via ORAL
  Filled 2021-08-23: qty 1

## 2021-08-23 MED ORDER — MENTHOL 3 MG MT LOZG: 1.0000 | LOZENGE | OROMUCOSAL | Status: DC | PRN

## 2021-08-23 MED ORDER — SODIUM CHLORIDE (PF) 0.9 % IJ SOLN
INTRAMUSCULAR | Status: AC
  Filled 2021-08-23: qty 30

## 2021-08-23 MED ORDER — POVIDONE-IODINE 10 % EX SWAB
2.0000 "application " | Freq: Once | CUTANEOUS | Status: AC
  Administered 2021-08-23: 2 via TOPICAL

## 2021-08-23 MED ORDER — POVIDONE-IODINE 10 % EX SWAB: 2.0000 "application " | Freq: Once | CUTANEOUS | Status: DC

## 2021-08-23 MED ORDER — EPHEDRINE 5 MG/ML INJ
INTRAVENOUS | Status: AC
  Filled 2021-08-23: qty 5

## 2021-08-23 MED ORDER — LIDOCAINE HCL (PF) 2 % IJ SOLN
INTRAMUSCULAR | Status: AC
  Filled 2021-08-23: qty 5

## 2021-08-23 MED ORDER — LACTATED RINGERS IV SOLN: INTRAVENOUS | Status: DC

## 2021-08-23 MED ORDER — METHOCARBAMOL 500 MG PO TABS
500.0000 mg | ORAL_TABLET | Freq: Four times a day (QID) | ORAL | Status: DC | PRN
  Administered 2021-08-23 – 2021-08-24 (×2): 500 mg via ORAL
  Filled 2021-08-23 (×2): qty 1

## 2021-08-23 MED ORDER — SODIUM CHLORIDE 0.9 % IV SOLN: INTRAVENOUS | Status: DC

## 2021-08-23 MED ORDER — FENTANYL CITRATE PF 50 MCG/ML IJ SOSY
PREFILLED_SYRINGE | INTRAMUSCULAR | Status: AC
  Administered 2021-08-23: 50 ug via INTRAVENOUS
  Filled 2021-08-23: qty 3

## 2021-08-23 MED ORDER — LORATADINE 10 MG PO TABS
10.0000 mg | ORAL_TABLET | Freq: Every day | ORAL | Status: DC
  Administered 2021-08-24: 10 mg via ORAL
  Filled 2021-08-23: qty 1

## 2021-08-23 MED ORDER — LACTATED RINGERS IV SOLN: INTRAVENOUS | Status: DC | PRN

## 2021-08-23 MED ORDER — ACETAMINOPHEN 500 MG PO TABS
1000.0000 mg | ORAL_TABLET | Freq: Once | ORAL | Status: AC
  Administered 2021-08-23: 1000 mg via ORAL
  Filled 2021-08-23: qty 2

## 2021-08-23 MED ORDER — ATORVASTATIN CALCIUM 40 MG PO TABS: 40.0000 mg | ORAL_TABLET | Freq: Every day | ORAL | Status: DC

## 2021-08-23 MED ORDER — ISOPROPYL ALCOHOL 70 % SOLN
Status: DC | PRN
  Administered 2021-08-23: 1 via TOPICAL

## 2021-08-23 MED ORDER — SODIUM CHLORIDE (PF) 0.9 % IJ SOLN
INTRAMUSCULAR | Status: DC | PRN
  Administered 2021-08-23: 30 mL

## 2021-08-23 MED ORDER — CHLORHEXIDINE GLUCONATE 0.12 % MT SOLN
15.0000 mL | Freq: Once | OROMUCOSAL | Status: AC
  Administered 2021-08-23: 15 mL via OROMUCOSAL

## 2021-08-23 MED ORDER — OXYCODONE HCL 5 MG/5ML PO SOLN: 5.0000 mg | Freq: Once | ORAL | Status: DC | PRN

## 2021-08-23 MED ORDER — NAPHAZOLINE-GLYCERIN 0.012-0.25 % OP SOLN: 1.0000 [drp] | Freq: Four times a day (QID) | OPHTHALMIC | Status: DC | PRN

## 2021-08-23 MED ORDER — ACETAMINOPHEN 325 MG PO TABS
325.0000 mg | ORAL_TABLET | Freq: Four times a day (QID) | ORAL | Status: DC | PRN
  Administered 2021-08-24: 650 mg via ORAL
  Filled 2021-08-23: qty 2

## 2021-08-23 MED ORDER — ONDANSETRON HCL 4 MG/2ML IJ SOLN: 4.0000 mg | Freq: Four times a day (QID) | INTRAMUSCULAR | Status: DC | PRN

## 2021-08-23 MED ORDER — TRANEXAMIC ACID-NACL 1000-0.7 MG/100ML-% IV SOLN
1000.0000 mg | INTRAVENOUS | Status: AC
  Administered 2021-08-23: 1000 mg via INTRAVENOUS
  Filled 2021-08-23: qty 100

## 2021-08-23 MED ORDER — DEXAMETHASONE SODIUM PHOSPHATE 10 MG/ML IJ SOLN
INTRAMUSCULAR | Status: DC | PRN
  Administered 2021-08-23: 10 mg via INTRAVENOUS

## 2021-08-23 MED ORDER — PROPOFOL 1000 MG/100ML IV EMUL
INTRAVENOUS | Status: AC
  Filled 2021-08-23: qty 100

## 2021-08-23 MED ORDER — BUPIVACAINE-EPINEPHRINE (PF) 0.25% -1:200000 IJ SOLN
INTRAMUSCULAR | Status: AC
  Filled 2021-08-23: qty 30

## 2021-08-23 MED ORDER — SENNA 8.6 MG PO TABS
1.0000 | ORAL_TABLET | Freq: Two times a day (BID) | ORAL | Status: DC
  Administered 2021-08-23 – 2021-08-24 (×2): 8.6 mg via ORAL
  Filled 2021-08-23 (×2): qty 1

## 2021-08-23 MED ORDER — MORPHINE SULFATE (PF) 2 MG/ML IV SOLN: 0.5000 mg | INTRAVENOUS | Status: DC | PRN

## 2021-08-23 MED ORDER — SODIUM CHLORIDE 0.9 % IR SOLN
Status: DC | PRN
  Administered 2021-08-23: 1000 mL

## 2021-08-23 MED ORDER — BUPIVACAINE-EPINEPHRINE 0.25% -1:200000 IJ SOLN
INTRAMUSCULAR | Status: DC | PRN
  Administered 2021-08-23: 30 mL

## 2021-08-23 MED ORDER — METHOCARBAMOL 1000 MG/10ML IJ SOLN
500.0000 mg | Freq: Four times a day (QID) | INTRAVENOUS | Status: DC | PRN
  Filled 2021-08-23: qty 5

## 2021-08-23 MED ORDER — METOCLOPRAMIDE HCL 5 MG PO TABS: 5.0000 mg | ORAL_TABLET | Freq: Three times a day (TID) | ORAL | Status: DC | PRN

## 2021-08-23 MED ORDER — DOCUSATE SODIUM 100 MG PO CAPS
100.0000 mg | ORAL_CAPSULE | Freq: Two times a day (BID) | ORAL | Status: DC
  Administered 2021-08-23 – 2021-08-24 (×2): 100 mg via ORAL
  Filled 2021-08-23 (×2): qty 1

## 2021-08-23 MED ORDER — ROCURONIUM BROMIDE 100 MG/10ML IV SOLN
INTRAVENOUS | Status: DC | PRN
  Administered 2021-08-23: 50 mg via INTRAVENOUS

## 2021-08-23 MED ORDER — PROPOFOL 500 MG/50ML IV EMUL
INTRAVENOUS | Status: AC
  Filled 2021-08-23: qty 50

## 2021-08-23 MED ORDER — POLYETHYLENE GLYCOL 3350 17 G PO PACK: 17.0000 g | PACK | Freq: Every day | ORAL | Status: DC | PRN

## 2021-08-23 MED ORDER — ONDANSETRON HCL 4 MG/2ML IJ SOLN
INTRAMUSCULAR | Status: AC
  Filled 2021-08-23: qty 2

## 2021-08-23 MED ORDER — HYDROCODONE-ACETAMINOPHEN 5-325 MG PO TABS
1.0000 | ORAL_TABLET | ORAL | Status: DC | PRN
  Administered 2021-08-23 – 2021-08-24 (×2): 1 via ORAL
  Filled 2021-08-23 (×3): qty 1

## 2021-08-23 MED ORDER — PROPOFOL 10 MG/ML IV BOLUS
INTRAVENOUS | Status: DC | PRN
  Administered 2021-08-23: 140 mg via INTRAVENOUS

## 2021-08-23 MED ORDER — PHENOL 1.4 % MT LIQD: 1.0000 | OROMUCOSAL | Status: DC | PRN

## 2021-08-23 MED ORDER — ORAL CARE MOUTH RINSE: 15.0000 mL | Freq: Once | OROMUCOSAL | Status: AC

## 2021-08-23 MED ORDER — ONDANSETRON HCL 4 MG/2ML IJ SOLN
INTRAMUSCULAR | Status: DC | PRN
  Administered 2021-08-23: 4 mg via INTRAVENOUS

## 2021-08-23 MED ORDER — AMLODIPINE BESYLATE 5 MG PO TABS: 5.0000 mg | ORAL_TABLET | Freq: Every day | ORAL | Status: DC

## 2021-08-23 MED ORDER — LIDOCAINE HCL (CARDIAC) PF 100 MG/5ML IV SOSY
PREFILLED_SYRINGE | INTRAVENOUS | Status: DC | PRN
  Administered 2021-08-23: 50 mg via INTRAVENOUS

## 2021-08-23 MED ORDER — ONDANSETRON HCL 4 MG PO TABS: 4.0000 mg | ORAL_TABLET | Freq: Four times a day (QID) | ORAL | Status: DC | PRN

## 2021-08-23 MED ORDER — METOCLOPRAMIDE HCL 5 MG/ML IJ SOLN: 5.0000 mg | Freq: Three times a day (TID) | INTRAMUSCULAR | Status: DC | PRN

## 2021-08-23 MED ORDER — FENTANYL CITRATE (PF) 100 MCG/2ML IJ SOLN
INTRAMUSCULAR | Status: DC | PRN
  Administered 2021-08-23: 50 ug via INTRAVENOUS
  Administered 2021-08-23: 100 ug via INTRAVENOUS
  Administered 2021-08-23: 50 ug via INTRAVENOUS

## 2021-08-23 MED ORDER — METOPROLOL TARTRATE 25 MG PO TABS
25.0000 mg | ORAL_TABLET | Freq: Two times a day (BID) | ORAL | Status: DC
  Administered 2021-08-23 – 2021-08-24 (×2): 25 mg via ORAL
  Filled 2021-08-23 (×2): qty 1

## 2021-08-23 MED ORDER — SUGAMMADEX SODIUM 200 MG/2ML IV SOLN
INTRAVENOUS | Status: DC | PRN
  Administered 2021-08-23: 150 mg via INTRAVENOUS

## 2021-08-23 MED ORDER — PROPOFOL 10 MG/ML IV BOLUS
INTRAVENOUS | Status: AC
  Filled 2021-08-23: qty 20

## 2021-08-23 MED ORDER — DEXAMETHASONE SODIUM PHOSPHATE 10 MG/ML IJ SOLN
INTRAMUSCULAR | Status: AC
  Filled 2021-08-23: qty 1

## 2021-08-23 MED ORDER — EPHEDRINE SULFATE-NACL 50-0.9 MG/10ML-% IV SOSY
PREFILLED_SYRINGE | INTRAVENOUS | Status: DC | PRN
  Administered 2021-08-23: 10 mg via INTRAVENOUS

## 2021-08-23 MED ORDER — FLUTICASONE PROPIONATE 50 MCG/ACT NA SUSP
1.0000 | Freq: Every day | NASAL | Status: DC
  Administered 2021-08-24: 1 via NASAL
  Filled 2021-08-23: qty 16

## 2021-08-23 MED ORDER — FENTANYL CITRATE PF 50 MCG/ML IJ SOSY
25.0000 ug | PREFILLED_SYRINGE | INTRAMUSCULAR | Status: DC | PRN
  Administered 2021-08-23: 50 ug via INTRAVENOUS

## 2021-08-23 MED ORDER — KETOROLAC TROMETHAMINE 30 MG/ML IJ SOLN
INTRAMUSCULAR | Status: DC | PRN
  Administered 2021-08-23: 30 mg

## 2021-08-23 MED ORDER — CLINDAMYCIN PHOSPHATE 900 MG/50ML IV SOLN
900.0000 mg | INTRAVENOUS | Status: AC
  Administered 2021-08-23: 900 mg via INTRAVENOUS
  Filled 2021-08-23: qty 50

## 2021-08-23 MED ORDER — OXYCODONE HCL 5 MG PO TABS: 5.0000 mg | ORAL_TABLET | Freq: Once | ORAL | Status: DC | PRN

## 2021-08-23 MED ORDER — ALUM & MAG HYDROXIDE-SIMETH 200-200-20 MG/5ML PO SUSP: 30.0000 mL | ORAL | Status: DC | PRN

## 2021-08-23 MED ORDER — OXYCODONE HCL 5 MG PO TABS
ORAL_TABLET | ORAL | Status: AC
  Filled 2021-08-23: qty 1

## 2021-08-23 MED ORDER — ESCITALOPRAM OXALATE 10 MG PO TABS
10.0000 mg | ORAL_TABLET | Freq: Every evening | ORAL | Status: DC
  Administered 2021-08-23: 10 mg via ORAL
  Filled 2021-08-23: qty 1

## 2021-08-23 MED ORDER — HYDROCODONE-ACETAMINOPHEN 7.5-325 MG PO TABS: 1.0000 | ORAL_TABLET | ORAL | Status: DC | PRN

## 2021-08-23 MED ORDER — ROCURONIUM BROMIDE 10 MG/ML (PF) SYRINGE
PREFILLED_SYRINGE | INTRAVENOUS | Status: AC
  Filled 2021-08-23: qty 10

## 2021-08-23 MED ORDER — DEXAMETHASONE SODIUM PHOSPHATE 10 MG/ML IJ SOLN
10.0000 mg | Freq: Once | INTRAMUSCULAR | Status: AC
  Administered 2021-08-24: 10 mg via INTRAVENOUS
  Filled 2021-08-23: qty 1

## 2021-08-23 MED ORDER — VANCOMYCIN HCL IN DEXTROSE 1-5 GM/200ML-% IV SOLN
1000.0000 mg | INTRAVENOUS | Status: AC
  Administered 2021-08-23: 1000 mg via INTRAVENOUS
  Filled 2021-08-23: qty 200

## 2021-08-23 MED ORDER — ONDANSETRON HCL 4 MG/2ML IJ SOLN: 4.0000 mg | Freq: Once | INTRAMUSCULAR | Status: DC | PRN

## 2021-08-23 SURGICAL SUPPLY — 65 items
BAG COUNTER SPONGE SURGICOUNT (BAG) IMPLANT
BAG DECANTER FOR FLEXI CONT (MISCELLANEOUS) IMPLANT
BAG ZIPLOCK 12X15 (MISCELLANEOUS) IMPLANT
BALL HIP CERAMIC (Hips) IMPLANT
CHLORAPREP W/TINT 26 (MISCELLANEOUS) ×2 IMPLANT
COVER PERINEAL POST (MISCELLANEOUS) ×2 IMPLANT
COVER SURGICAL LIGHT HANDLE (MISCELLANEOUS) ×2 IMPLANT
CUP SECTOR GRIPTON 50MM (Cup) ×1 IMPLANT
DERMABOND ADVANCED (GAUZE/BANDAGES/DRESSINGS) ×2
DERMABOND ADVANCED .7 DNX12 (GAUZE/BANDAGES/DRESSINGS) ×2 IMPLANT
DRAPE IMP U-DRAPE 54X76 (DRAPES) ×2 IMPLANT
DRAPE SHEET LG 3/4 BI-LAMINATE (DRAPES) ×6 IMPLANT
DRAPE STERI IOBAN 125X83 (DRAPES) ×2 IMPLANT
DRAPE U-SHAPE 47X51 STRL (DRAPES) ×4 IMPLANT
DRSG AQUACEL AG ADV 3.5X10 (GAUZE/BANDAGES/DRESSINGS) ×2 IMPLANT
DRSG TEGADERM 4X4.75 (GAUZE/BANDAGES/DRESSINGS) ×1 IMPLANT
ELECT REM PT RETURN 15FT ADLT (MISCELLANEOUS) ×2 IMPLANT
EVACUATOR DRAINAGE 7X20 100CC (MISCELLANEOUS) IMPLANT
EVACUATOR SILICONE 100CC (MISCELLANEOUS) ×1
GAUZE SPONGE 4X4 12PLY STRL (GAUZE/BANDAGES/DRESSINGS) ×2 IMPLANT
GLOVE SRG 8 PF TXTR STRL LF DI (GLOVE) ×1 IMPLANT
GLOVE SURG ENC MOIS LTX SZ8.5 (GLOVE) ×4 IMPLANT
GLOVE SURG ENC TEXT LTX SZ7.5 (GLOVE) ×4 IMPLANT
GLOVE SURG UNDER POLY LF SZ8 (GLOVE) ×1
GLOVE SURG UNDER POLY LF SZ8.5 (GLOVE) ×2 IMPLANT
GOWN SPEC L3 XXLG W/TWL (GOWN DISPOSABLE) ×3 IMPLANT
GOWN STRL REUS W/TWL XL LVL3 (GOWN DISPOSABLE) ×2 IMPLANT
HANDPIECE INTERPULSE COAX TIP (DISPOSABLE) ×1
HIP BALL CERAMIC (Hips) ×2 IMPLANT
HOLDER FOLEY CATH W/STRAP (MISCELLANEOUS) ×1 IMPLANT
HOOD PEEL AWAY FLYTE STAYCOOL (MISCELLANEOUS) ×6 IMPLANT
JET LAVAGE IRRISEPT WOUND (IRRIGATION / IRRIGATOR) ×2
KIT TURNOVER KIT A (KITS) IMPLANT
LAVAGE JET IRRISEPT WOUND (IRRIGATION / IRRIGATOR) ×1 IMPLANT
LINER ACETABULAR 32X50 (Liner) ×1 IMPLANT
MANIFOLD NEPTUNE II (INSTRUMENTS) ×2 IMPLANT
MARKER SKIN DUAL TIP RULER LAB (MISCELLANEOUS) ×2 IMPLANT
NDL SAFETY ECLIPSE 18X1.5 (NEEDLE) ×1 IMPLANT
NDL SPNL 18GX3.5 QUINCKE PK (NEEDLE) ×1 IMPLANT
NEEDLE HYPO 18GX1.5 SHARP (NEEDLE) ×1
NEEDLE SPNL 18GX3.5 QUINCKE PK (NEEDLE) ×2 IMPLANT
PACK ANTERIOR HIP CUSTOM (KITS) ×2 IMPLANT
PENCIL SMOKE EVACUATOR (MISCELLANEOUS) IMPLANT
SAW OSC TIP CART 19.5X105X1.3 (SAW) ×2 IMPLANT
SEALER BIPOLAR AQUA 6.0 (INSTRUMENTS) ×2 IMPLANT
SET HNDPC FAN SPRY TIP SCT (DISPOSABLE) ×1 IMPLANT
SPIKE FLUID TRANSFER (MISCELLANEOUS) ×2 IMPLANT
SPONGE DRAIN TRACH 4X4 STRL 2S (GAUZE/BANDAGES/DRESSINGS) ×1 IMPLANT
SPONGE T-LAP 18X18 ~~LOC~~+RFID (SPONGE) ×5 IMPLANT
STAPLER INSORB 30 2030 C-SECTI (MISCELLANEOUS) IMPLANT
STAPLER VISISTAT 35W (STAPLE) ×1 IMPLANT
STEM TRI LOC BPS SZ6 W GRIPTON IMPLANT
SUT ETHILON 3 0 FSL (SUTURE) ×1 IMPLANT
SUT MNCRL AB 3-0 PS2 18 (SUTURE) ×2 IMPLANT
SUT MON AB 2-0 CT1 36 (SUTURE) ×4 IMPLANT
SUT STRATAFIX PDO 1 14 VIOLET (SUTURE) ×1
SUT STRATFX PDO 1 14 VIOLET (SUTURE) ×1
SUT VIC AB 2-0 CT1 27 (SUTURE) ×1
SUT VIC AB 2-0 CT1 TAPERPNT 27 (SUTURE) IMPLANT
SUTURE STRATFX PDO 1 14 VIOLET (SUTURE) ×1 IMPLANT
SYR 3ML LL SCALE MARK (SYRINGE) ×2 IMPLANT
TRAY FOLEY MTR SLVR 16FR STAT (SET/KITS/TRAYS/PACK) IMPLANT
TRI LOC BPS SZ 6 W GRIPTON ×2 IMPLANT
TUBE SUCTION HIGH CAP CLEAR NV (SUCTIONS) ×2 IMPLANT
WATER STERILE IRR 1000ML POUR (IV SOLUTION) ×3 IMPLANT

## 2021-08-23 NOTE — Op Note (Signed)
OPERATIVE REPORT  SURGEON: Samson Frederic, MD   ASSISTANT: Skip Mayer, PA-C.  PREOPERATIVE DIAGNOSIS: Left hip arthritis.   POSTOPERATIVE DIAGNOSIS: Left hip arthritis.   PROCEDURE: Left total hip arthroplasty, anterior approach.   IMPLANTS: DePuy Tri Lock stem, size 6, high offset. DePuy Pinnacle Cup, size 50 mm. DePuy Altrx liner, size 32 by 50 mm, neutral. DePuy Biolox ceramic head ball, size 32 + 5 mm.  ANESTHESIA:  General  ESTIMATED BLOOD LOSS: 300 mL.  ANTIBIOTICS: 1 g Vancomycin. 900 mg Clindamycin.  DRAINS: JP drain in subcutaneous tissue.  COMPLICATIONS: None.   CONDITION: PACU - hemodynamically stable.   BRIEF CLINICAL NOTE: Melissa Price is a 71 y.o. female with a long-standing history of Left hip arthritis. After failing conservative management, the patient was indicated for total hip arthroplasty. The risks, benefits, and alternatives to the procedure were explained, and the patient elected to proceed.  PROCEDURE IN DETAIL: Surgical site was marked by myself in the pre-op holding area. Once inside the operating room, spinal anesthesia was obtained, and a foley catheter was inserted. The patient was then positioned on the Hana table.  All bony prominences were well padded.  The hip was prepped and draped in the normal sterile surgical fashion.  A time-out was called verifying side and site of surgery. The patient received IV antibiotics within 60 minutes of beginning the procedure.   Bikini incision was created three finger breadths distal to the ASIS, and superficial dissection was carried out lateral to the ASIS. The direct anterior approach to the hip was performed through the Hueter interval.  Lateral femoral circumflex vessels were treated with the Auqumantys. The anterior capsule was exposed and an inverted T capsulotomy was made. The femoral neck cut was made to the level of the templated cut.  A corkscrew was placed into the head and the head was removed.   The femoral head was found to have eburnated bone. The head was passed to the back table and was measured. Pubofemoral ligament was released off of the calcar, taking care to stay on bone. Superior capsule was released from the greater trochanter, taking care to stay lateral to the posterior border of the femoral neck in order to preserve the short external rotators.   Acetabular exposure was achieved, and the pulvinar and labrum were excised. Sequential reaming of the acetabulum was then performed up to a size 49 mm reamer under direct visulization. A 50 mm cup was then opened and impacted into place at approximately 40 degrees of abduction and 20 degrees of anteversion. The final polyethylene liner was impacted into place and acetabular osteophytes were removed.    I then gained femoral exposure taking care to protect the abductors and greater trochanter.  This was performed using standard external rotation, extension, and adduction.  A cookie cutter was used to enter the femoral canal, and then the femoral canal finder was placed.  Sequential broaching was performed up to a size 6.  Calcar planer was used on the femoral neck remnant.  I placed a high offset neck and a trial head ball.  The hip was reduced.  Leg lengths and offset were checked fluoroscopically.  The hip was dislocated and trial components were removed.  The final implants were placed, and the hip was reduced.  Fluoroscopy was used to confirm component position and leg lengths.  At 90 degrees of external rotation and full extension, the hip was stable to an anterior directed force.   The wound was copiously  irrigated with Irrisept solution and normal saline using pule lavage.  Marcaine solution was injected into the periarticular soft tissue.  A JP drain was placed in the subcutaneous tissue and brought out through a separate stab in the distal lateral thigh. The wound was closed in layers using #1 Stratafix for the fascia, 2-0 Vicryl for the  subcutaneous fat, 2-0 Monocryl for the deep dermal layer, and staples + Dermabond for the skin.  Once the glue was fully dried, an Aquacell Ag dressing was applied.  The patient was transported to the recovery room in stable condition.  Sponge, needle, and instrument counts were correct at the end of the case x2.  The patient tolerated the procedure well and there were no known complications.  Please note that a surgical assistant was a medical necessity for this procedure to perform it in a safe and expeditious manner. Assistant was necessary to provide appropriate retraction of vital neurovascular structures, to prevent femoral fracture, and to allow for anatomic placement of the prosthesis.

## 2021-08-23 NOTE — Anesthesia Postprocedure Evaluation (Signed)
Anesthesia Post Note  Patient: Melissa Price  Procedure(s) Performed: TOTAL HIP ARTHROPLASTY ANTERIOR APPROACH (Left: Hip)     Patient location during evaluation: PACU Anesthesia Type: General Level of consciousness: awake and alert Pain management: pain level controlled Vital Signs Assessment: post-procedure vital signs reviewed and stable Respiratory status: spontaneous breathing, nonlabored ventilation, respiratory function stable and patient connected to nasal cannula oxygen Cardiovascular status: blood pressure returned to baseline and stable Postop Assessment: no apparent nausea or vomiting Anesthetic complications: no   No notable events documented.  Last Vitals:  Vitals:   08/23/21 1300 08/23/21 1303  BP: 123/60   Pulse: 63   Resp: 11   Temp:    SpO2: 100% 94%    Last Pain:  Vitals:   08/23/21 1303  TempSrc:   PainSc: 2                  Beryle Lathe

## 2021-08-23 NOTE — Transfer of Care (Signed)
Immediate Anesthesia Transfer of Care Note  Patient: Melissa Price  Procedure(s) Performed: TOTAL HIP ARTHROPLASTY ANTERIOR APPROACH (Left: Hip)  Patient Location: PACU  Anesthesia Type:General  Level of Consciousness: awake, alert  and oriented  Airway & Oxygen Therapy: Patient Spontanous Breathing and Patient connected to face mask oxygen  Post-op Assessment: Report given to RN and Post -op Vital signs reviewed and stable  Post vital signs: Reviewed and stable  Last Vitals:  Vitals Value Taken Time  BP 147/135 08/23/21 1227  Temp    Pulse 75 08/23/21 1230  Resp 11 08/23/21 1230  SpO2 100 % 08/23/21 1230  Vitals shown include unvalidated device data.  Last Pain:  Vitals:   08/23/21 0851  TempSrc: Oral  PainSc:          Complications: No notable events documented.

## 2021-08-23 NOTE — Evaluation (Signed)
Physical Therapy Evaluation Patient Details Name: Melissa Price MRN: [id number] DOB: Jul 30, 1950 Today's Date: 08/23/2021  History of Present Illness  Pt s/p L DATHA 08/23/2021, with PMH for HTN, osteopenia, GERD , a-fib and R DATHA 06/14/2020  Clinical Impression  Pt s/p L DATHA present with limited mobility, able to ambulate tonight into hallway with RW and will continue to follow with plan to DC home in the morning with her husband after practicing steps, walking, and exercises.    Recommendations for follow up therapy are one component of a multi-disciplinary discharge planning process, led by the attending physician.  Recommendations may be updated based on patient status, additional functional criteria and insurance authorization.  Follow Up Recommendations Follow physician's recommendations for discharge plan and follow up therapies    Assistance Recommended at Discharge Set up Supervision/Assistance  Patient can return home with the following  A little help with walking and/or transfers;A little help with bathing/dressing/bathroom;Assist for transportation    Equipment Recommendations None recommended by PT  Recommendations for Other Services       Functional Status Assessment Patient has had a recent decline in their functional status and demonstrates the ability to make significant improvements in function in a reasonable and predictable amount of time.     Precautions / Restrictions Precautions Precautions: None Restrictions Weight Bearing Restrictions: No      Mobility  Bed Mobility Overal bed mobility: Needs Assistance Bed Mobility: Supine to Sit, Sit to Supine     Supine to sit: Min assist     General bed mobility comments: assistedLLE    Transfers Overall transfer level: Needs assistance Equipment used: Rollator (4 wheels) Transfers: Sit to/from Stand Sit to Stand: Min guard           General transfer comment: cues for hand placement on RW and  safety    Ambulation/Gait Ambulation/Gait assistance: Min guard Gait Distance (Feet): 40 Feet Assistive device: Rollator (4 wheels) Gait Pattern/deviations: Step-to pattern       General Gait Details: Step to pattern, however transitioned to a step through pattern quickly . Limited distance to make sure pt was not going to get dizzy, and no reports with this distance.  Stairs            Wheelchair Mobility    Modified Rankin (Stroke Patients Only)       Balance Overall balance assessment: Needs assistance Sitting-balance support: Bilateral upper extremity supported, Feet supported Sitting balance-Leahy Scale: Good     Standing balance support: Bilateral upper extremity supported, During functional activity Standing balance-Leahy Scale: Fair                               Pertinent Vitals/Pain Pain Assessment Pain Assessment: 0-10 Pain Score: 3  Pain Location: L hip area anterior thigh Pain Descriptors / Indicators: Aching, Sore Pain Intervention(s): Limited activity within patient's tolerance, Ice applied, Monitored during session    Home Living Family/patient expects to be discharged to:: Private residence Living Arrangements: Spouse/significant other Available Help at Discharge: Family Type of Home: House Home Access: Stairs to enter Entrance Stairs-Rails: None Entrance Stairs-Number of Steps: 2   Home Layout: Two level;Bed/bath upstairs (has a lift for stairs inside her house) Home Equipment: Agricultural consultant (2 wheels)      Prior Function Prior Level of Function : Independent/Modified Independent  Hand Dominance        Extremity/Trunk Assessment        Lower Extremity Assessment Lower Extremity Assessment: LLE deficits/detail LLE Deficits / Details: moving fairly well 3/5 for hip flexion with a little assistance against gravity for lifting on and off bed.       Communication   Communication: No  difficulties  Cognition Arousal/Alertness: Awake/alert Behavior During Therapy: WFL for tasks assessed/performed Overall Cognitive Status: Within Functional Limits for tasks assessed                                          General Comments      Exercises Total Joint Exercises Ankle Circles/Pumps: AROM, Both, 10 reps, Supine Quad Sets: AROM, Supine, Left, 10 reps Heel Slides: AAROM, Supine, Left, 10 reps Hip ABduction/ADduction: AAROM, Supine, Left, 10 reps   Assessment/Plan    PT Assessment Patient needs continued PT services  PT Problem List Decreased strength;Decreased range of motion;Decreased activity tolerance;Decreased mobility       PT Treatment Interventions DME instruction;Gait training;Stair training;Functional mobility training;Therapeutic activities;Therapeutic exercise;Patient/family education    PT Goals (Current goals can be found in the Care Plan section)  Acute Rehab PT Goals Patient Stated Goal: I want to get back to walking and exercising PT Goal Formulation: With patient Time For Goal Achievement: 08/30/21 Potential to Achieve Goals: Good    Frequency 7X/week     Co-evaluation               AM-PAC PT "6 Clicks" Mobility  Outcome Measure Help needed turning from your back to your side while in a flat bed without using bedrails?: A Little Help needed moving from lying on your back to sitting on the side of a flat bed without using bedrails?: A Little Help needed moving to and from a bed to a chair (including a wheelchair)?: A Little Help needed standing up from a chair using your arms (e.g., wheelchair or bedside chair)?: A Little Help needed to walk in hospital room?: A Little Help needed climbing 3-5 steps with a railing? : A Little 6 Click Score: 18    End of Session Equipment Utilized During Treatment: Gait belt Activity Tolerance: Patient tolerated treatment well Patient left: with call bell/phone within reach;in  chair;with chair alarm set Nurse Communication: Mobility status PT Visit Diagnosis: Unsteadiness on feet (R26.81)    Time: 6734-1937 PT Time Calculation (min) (ACUTE ONLY): 45 min   Charges:   PT Evaluation $PT Eval Low Complexity: 1 Low PT Treatments $Gait Training: 8-22 mins $Therapeutic Exercise: 8-22 mins        Ryheem Jay, PT, MPT Acute Rehabilitation Services Office: [phone] Pager: [phone] 08/23/2021   Marella Bile 08/23/2021, 8:46 PM

## 2021-08-23 NOTE — Discharge Instructions (Signed)
? ?Dr. Alaysia Lightle ?Joint Replacement Specialist ?[facility] ?[address] ?[place], [place] [address] ?[phone] ? ? ?TOTAL HIP REPLACEMENT POSTOPERATIVE DIRECTIONS ? ? ? ?Hip Rehabilitation, Guidelines Following Surgery  ? ?WEIGHT BEARING ?Weight bearing as tolerated with assist device (walker, cane, etc) as directed, use it as long as suggested by your surgeon or therapist, typically at least 4-6 weeks. ? ?The results of a hip operation are greatly improved after range of motion and muscle strengthening exercises. Follow all safety measures which are given to protect your hip. If any of these exercises cause increased pain or swelling in your joint, decrease the amount until you are comfortable again. Then slowly increase the exercises. Call your caregiver if you have problems or questions.  ? ?HOME CARE INSTRUCTIONS  ?Most of the following instructions are designed to prevent the dislocation of your new hip.  ?Remove items at home which could result in a fall. This includes throw rugs or furniture in walking pathways.  ?Continue medications as instructed at time of discharge. ?You may have some home medications which will be placed on hold until you complete the course of blood thinner medication. ?You may start showering once you are discharged home. Do not remove your dressing. ?Do not put on socks or shoes without following the instructions of your caregivers.   ?Sit on chairs with arms. Use the chair arms to help push yourself up when arising.  ?Arrange for the use of a toilet seat elevator so you are not sitting low.  ?Walk with walker as instructed.  ?You may resume a sexual relationship in one month or when given the OK by your caregiver.  ?Use walker as long as suggested by your caregivers.  ?You may put full weight on your legs and walk as much as is comfortable. ?Avoid periods of inactivity such as sitting longer than an hour when not asleep. This helps prevent blood  clots.  ?You may return to work once you are cleared by your surgeon.  ?Do not drive a car for 6 weeks or until released by your surgeon.  ?Do not drive while taking narcotics.  ?Wear elastic stockings for two weeks following surgery during the day but you may remove then at night.  ?Make sure you keep all of your appointments after your operation with all of your doctors and caregivers. You should call the office at the above phone number and make an appointment for approximately two weeks after the date of your surgery. ?Please pick up a stool softener and laxative for home use as long as you are requiring pain medications. ?ICE to the affected hip every three hours for 30 minutes at a time and then as needed for pain and swelling. Continue to use ice on the hip for pain and swelling from surgery. You may notice swelling that will progress down to the foot and ankle.  This is normal after surgery.  Elevate the leg when you are not up walking on it.   ?It is important for you to complete the blood thinner medication as prescribed by your doctor. ?Continue to use the breathing machine which will help keep your temperature down.  It is common for your temperature to cycle up and down following surgery, especially at night when you are not up moving around and exerting yourself.  The breathing machine keeps your lungs expanded and your temperature down. ? ?RANGE OF MOTION AND STRENGTHENING EXERCISES  ?These exercises are designed to help you   keep full movement of your hip joint. Follow your caregiver's or physical therapist's instructions. Perform all exercises about fifteen times, three times per day or as directed. Exercise both hips, even if you have had only one joint replacement. These exercises can be done on a training (exercise) mat, on the floor, on a table or on a bed. Use whatever works the best and is most comfortable for you. Use music or television while you are exercising so that the exercises are a  pleasant break in your day. This will make your life better with the exercises acting as a break in routine you can look forward to.  ?Lying on your back, slowly slide your foot toward your buttocks, raising your knee up off the floor. Then slowly slide your foot back down until your leg is straight again.  ?Lying on your back spread your legs as far apart as you can without causing discomfort.  ?Lying on your side, raise your upper leg and foot straight up from the floor as far as is comfortable. Slowly lower the leg and repeat.  ?Lying on your back, tighten up the muscle in the front of your thigh (quadriceps muscles). You can do this by keeping your leg straight and trying to raise your heel off the floor. This helps strengthen the largest muscle supporting your knee.  ?Lying on your back, tighten up the muscles of your buttocks both with the legs straight and with the knee bent at a comfortable angle while keeping your heel on the floor.  ? ?SKILLED REHAB INSTRUCTIONS: ?If the patient is transferred to a skilled rehab facility following release from the hospital, a list of the current medications will be sent to the facility for the patient to continue.  When discharged from the skilled rehab facility, please have the facility set up the patient's Home Health Physical Therapy prior to being released. Also, the skilled facility will be responsible for providing the patient with their medications at time of release from the facility to include their pain medication and their blood thinner medication. If the patient is still at the rehab facility at time of the two week follow up appointment, the skilled rehab facility will also need to assist the patient in arranging follow up appointment in our office and any transportation needs. ? ?POST-OPERATIVE OPIOID TAPER INSTRUCTIONS: ?It is important to wean off of your opioid medication as soon as possible. If you do not need pain medication after your surgery it is ok  to stop day one. ?Opioids include: ?Codeine, Hydrocodone(Norco, Vicodin), Oxycodone(Percocet, oxycontin) and hydromorphone amongst others.  ?Long term and even short term use of opiods can cause: ?Increased pain response ?Dependence ?Constipation ?Depression ?Respiratory depression ?And more.  ?Withdrawal symptoms can include ?Flu like symptoms ?Nausea, vomiting ?And more ?Techniques to manage these symptoms ?Hydrate well ?Eat regular healthy meals ?Stay active ?Use relaxation techniques(deep breathing, meditating, yoga) ?Do Not substitute Alcohol to help with tapering ?If you have been on opioids for less than two weeks and do not have pain than it is ok to stop all together.  ?Plan to wean off of opioids ?This plan should start within one week post op of your joint replacement. ?Maintain the same interval or time between taking each dose and first decrease the dose.  ?Cut the total daily intake of opioids by one tablet each day ?Next start to increase the time between doses. ?The last dose that should be eliminated is the evening dose.  ? ? ?MAKE   SURE YOU:  ?Understand these instructions.  ?Will watch your condition.  ?Will get help right away if you are not doing well or get worse. ? ?Pick up stool softner and laxative for home use following surgery while on pain medications. ?Do not remove your dressing. ?The dressing is waterproof--it is OK to take showers. ?Continue to use ice for pain and swelling after surgery. ?Do not use any lotions or creams on the incision until instructed by your surgeon. ?Total Hip Protocol. ? ?

## 2021-08-23 NOTE — Anesthesia Preprocedure Evaluation (Addendum)
Anesthesia Evaluation  Patient identified by MRN, date of birth, ID band Patient awake    Reviewed: Allergy & Precautions, NPO status , Patient's Chart, lab work & pertinent test results, reviewed documented beta blocker date and time   History of Anesthesia Complications Negative for: history of anesthetic complications  Airway Mallampati: II  TM Distance: >3 FB Neck ROM: Full    Dental  (+) Dental Advisory Given, Teeth Intact   Pulmonary neg pulmonary ROS,    Pulmonary exam normal        Cardiovascular hypertension, Pt. on medications and Pt. on home beta blockers + dysrhythmias Atrial Fibrillation  Rhythm:Irregular Rate:Normal   '21 TTE - EF 60 to 65%. RV size is mildly enlarged. There is moderately elevated pulmonary artery systolic pressure. The estimated right ventricular systolic pressure is [id number] mmHg. LA and RA were severely dilated. The pericardial effusion is circumferential. Mild mitral valve regurgitation. Mild aortic valve sclerosis is present, with no evidence of aortic valve stenosis.     Neuro/Psych negative neurological ROS  negative psych ROS   GI/Hepatic negative GI ROS, Neg liver ROS,   Endo/Other  negative endocrine ROS  Renal/GU negative Renal ROS     Musculoskeletal  (+) Arthritis , Osteoarthritis,    Abdominal   Peds  Hematology  On xarelto, last dose <72 hrs ago     Anesthesia Other Findings   Reproductive/Obstetrics                            Anesthesia Physical Anesthesia Plan  ASA: 3  Anesthesia Plan: General   Post-op Pain Management: Tylenol PO (pre-op)*   Induction: Intravenous  PONV Risk Score and Plan: 3 and Treatment may vary due to age or medical condition, Ondansetron and Dexamethasone  Airway Management Planned: LMA  Additional Equipment: None  Intra-op Plan:   Post-operative Plan: Extubation in OR  Informed Consent: I have reviewed the  patients History and Physical, chart, labs and discussed the procedure including the risks, benefits and alternatives for the proposed anesthesia with the patient or authorized representative who has indicated his/her understanding and acceptance.     Dental advisory given  Plan Discussed with: CRNA and Anesthesiologist  Anesthesia Plan Comments:        Anesthesia Quick Evaluation

## 2021-08-23 NOTE — Anesthesia Procedure Notes (Signed)
Procedure Name: Intubation Date/Time: 08/23/2021 10:38 AM Performed by: [place] Onica Davidovich C, CRNA Pre-anesthesia Checklist: Patient identified, Emergency Drugs available, Suction available and Patient being monitored Patient Re-evaluated:Patient Re-evaluated prior to induction Oxygen Delivery Method: Circle system utilized Preoxygenation: Pre-oxygenation with 100% oxygen Induction Type: IV induction Ventilation: Mask ventilation without difficulty Laryngoscope Size: Mac and 3 Grade View: Grade I Tube type: Oral Tube size: 7.0 mm Number of attempts: 1 Airway Equipment and Method: Stylet and Oral airway Placement Confirmation: ETT inserted through vocal cords under direct vision, positive ETCO2 and breath sounds checked- equal and bilateral Secured at: 20 cm Tube secured with: Tape Dental Injury: Teeth and Oropharynx as per pre-operative assessment

## 2021-08-23 NOTE — H&P (Signed)
TOTAL HIP ADMISSION H&P  Patient is admitted for left total hip arthroplasty.  Subjective:  Chief Complaint: left hip pain  HPI: Melissa Price, 71 y.o. female, has a history of pain and functional disability in the left hip(s) due to arthritis and patient has failed non-surgical conservative treatments for greater than 12 weeks to include NSAID's and/or analgesics, corticosteriod injections, flexibility and strengthening excercises, use of assistive devices, weight reduction as appropriate, and activity modification.  Onset of symptoms was gradual starting 5 years ago with rapidlly worsening course since that time.The patient noted no past surgery on the left hip(s).  Patient currently rates pain in the left hip at 10 out of 10 with activity. Patient has night pain, worsening of pain with activity and weight bearing, trendelenberg gait, pain that interfers with activities of daily living, and pain with passive range of motion. Patient has evidence of subchondral cysts, subchondral sclerosis, periarticular osteophytes, and joint space narrowing by imaging studies. This condition presents safety issues increasing the risk of falls.  There is no current active infection.  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 Rosalee Kaufman - Arthroscopic   TOTAL HIP ARTHROPLASTY Right  06/14/2020   Procedure: TOTAL HIP ARTHROPLASTY ANTERIOR APPROACH;  Surgeon: Rod Can, MD;  Location: [facility];  Service: Orthopedics;  Laterality: Right;    Current Facility-Administered Medications  Medication Dose Route Frequency Provider Last Rate Last Admin   0.9 %  sodium chloride infusion   Intravenous Continuous Shatera Rennert, Aaron Edelman, MD       clindamycin (CLEOCIN) IVPB 900 mg  900 mg Intravenous On Call to OR Rod Can, MD       lactated ringers infusion   Intravenous Continuous Lynda Rainwater, MD 10 mL/hr at 08/23/21 0856 New Bag at 08/23/21 0856   tranexamic acid (CYKLOKAPRON) IVPB 1,000 mg  1,000 mg Intravenous To OR Tracee Mccreery, Aaron Edelman, MD       vancomycin (VANCOCIN) IVPB 1000 mg/200 mL premix  1,000 mg Intravenous On Call to OR Rod Can, MD       Allergies  Allergen Reactions   Penicillins Shortness Of Breath    amoxicillin    Social History   Tobacco Use   Smoking status: Never   Smokeless tobacco: Never  Substance Use Topics   Alcohol use: Yes    Comment: occasoinal     Family History  Problem Relation Age of Onset   Heart disease Mother    Heart disease Father      Review of Systems  HENT:  Positive for rhinorrhea.   Eyes:  Positive for redness and itching.  Musculoskeletal:  Positive for arthralgias, gait problem and myalgias.  Allergic/Immunologic: Positive for environmental allergies.  Hematological:  Bruises/bleeds easily.  All other systems reviewed and are negative.  Objective:  Physical  Exam Constitutional:      Appearance: Normal appearance.  HENT:     Head: Normocephalic and atraumatic.     Nose: Congestion present.     Mouth/Throat:     Mouth: Mucous membranes are moist.     Pharynx: Oropharynx is clear.  Eyes:     Extraocular Movements: Extraocular movements intact.     Conjunctiva/sclera: Conjunctivae normal.     Pupils: Pupils are equal, round, and reactive to light.  Cardiovascular:     Rate and Rhythm: Normal rate and  regular rhythm.     Pulses: Normal pulses.  Pulmonary:     Effort: Pulmonary effort is normal. No respiratory distress.  Abdominal:     General: Abdomen is flat. There is no distension.     Palpations: Abdomen is soft.  Genitourinary:    Comments: deferred Musculoskeletal:     Cervical back: Normal range of motion and neck supple.     Left hip: Bony tenderness present. Decreased range of motion. Decreased strength.  Skin:    General: Skin is warm and dry.     Capillary Refill: Capillary refill takes less than 2 seconds.  Neurological:     General: No focal deficit present.     Mental Status: She is alert and oriented to person, place, and time.  Psychiatric:        Mood and Affect: Mood normal.        Behavior: Behavior normal.        Thought Content: Thought content normal.        Judgment: Judgment normal.    Vital signs in last 24 hours: Temp:  [99.4 F (37.4 C)] 99.4 F (37.4 C) (02/16 0851) Pulse Rate:  [60] 60 (02/16 0851) Resp:  [16] 16 (02/16 0851) BP: (169)/(69) 169/69 (02/16 0851) SpO2:  [100 %] 100 % (02/16 0851) Weight:  [61.7 kg] 61.7 kg (02/16 0816)  Labs:   Estimated body mass index is 24.1 kg/m as calculated from the following:   Height as of this encounter: 5\' 3"  (1.6 m).   Weight as of this encounter: 61.7 kg.   Imaging Review Plain radiographs demonstrate severe degenerative joint disease of the left hip(s). The bone quality appears to be adequate for age and reported activity level.      Assessment/Plan:  End stage arthritis, left hip(s)  The patient history, physical examination, clinical judgement of the provider and imaging studies are consistent with end stage degenerative joint disease of the left hip(s) and total hip arthroplasty is deemed medically necessary. The treatment options including medical management, injection therapy, arthroscopy and arthroplasty were discussed at length. The risks and benefits of total hip arthroplasty were  presented and reviewed. The risks due to aseptic loosening, infection, stiffness, dislocation/subluxation,  thromboembolic complications and other imponderables were discussed.  The patient acknowledged the explanation, agreed to proceed with the plan and consent was signed. Patient is being admitted for inpatient treatment for surgery, pain control, PT, OT, prophylactic antibiotics, VTE prophylaxis, progressive ambulation and ADL's and discharge planning.The patient is planning to be discharged  home with HEP.  Takes Xarelto.    Patient's anticipated LOS is less than 2 midnights, meeting these requirements: - Younger than 16 - Lives within 1 hour of care - Has a competent adult at home to recover with post-op recover - NO history of  - Chronic pain requiring opiods  - Diabetes  - Coronary Artery Disease  - Heart failure  - Heart attack  - Stroke  -  DVT/VTE  - Respiratory Failure/COPD  - Renal failure  - Anemia  - Advanced Liver disease

## 2021-08-24 DIAGNOSIS — M1612 Unilateral primary osteoarthritis, left hip: Secondary | ICD-10-CM | POA: Diagnosis not present

## 2021-08-24 LAB — CBC
HCT: 29.4 % — ABNORMAL LOW (ref 36.0–46.0)
Hemoglobin: 9.4 g/dL — ABNORMAL LOW (ref 12.0–15.0)
MCH: 32.2 pg (ref 26.0–34.0)
MCHC: 32 g/dL (ref 30.0–36.0)
MCV: 100.7 fL — ABNORMAL HIGH (ref 80.0–100.0)
Platelets: 186 10*3/uL (ref 150–400)
RBC: 2.92 MIL/uL — ABNORMAL LOW (ref 3.87–5.11)
RDW: 12.9 % (ref 11.5–15.5)
WBC: 9.4 10*3/uL (ref 4.0–10.5)
nRBC: 0 % (ref 0.0–0.2)

## 2021-08-24 LAB — BASIC METABOLIC PANEL
Anion gap: 4 — ABNORMAL LOW (ref 5–15)
BUN: 19 mg/dL (ref 8–23)
CO2: 27 mmol/L (ref 22–32)
Calcium: 8.8 mg/dL — ABNORMAL LOW (ref 8.9–10.3)
Chloride: 107 mmol/L (ref 98–111)
Creatinine, Ser: 0.77 mg/dL (ref 0.44–1.00)
GFR, Estimated: >60 mL/min (ref >60)
Glucose, Bld: 158 mg/dL — ABNORMAL HIGH (ref 70–99)
Potassium: 4.2 mmol/L (ref 3.5–5.1)
Sodium: 138 mmol/L (ref 135–145)

## 2021-08-24 MED ORDER — HYDROCODONE-ACETAMINOPHEN 5-325 MG PO TABS
1.0000 | ORAL_TABLET | ORAL | 0 refills | Status: AC | PRN
Start: 2021-08-24 — End: ?

## 2021-08-24 MED ORDER — SENNA 8.6 MG PO TABS: 2.0000 | ORAL_TABLET | Freq: Every day | ORAL | 1 refills | Status: AC

## 2021-08-24 MED ORDER — DOCUSATE SODIUM 100 MG PO CAPS: 100.0000 mg | ORAL_CAPSULE | Freq: Two times a day (BID) | ORAL | 1 refills | Status: AC

## 2021-08-24 MED ORDER — ONDANSETRON HCL 4 MG PO TABS: 4.0000 mg | ORAL_TABLET | Freq: Four times a day (QID) | ORAL | 0 refills | Status: AC | PRN

## 2021-08-24 NOTE — Discharge Summary (Signed)
Patient ID: Melissa Price MRN: [id number] DOB/AGE: 12-28-1950 70 y.o.  Admit date: 08/23/2021 Discharge date: 08/24/2021  Admission Diagnoses:  Principal Problem:   Osteoarthritis of left hip Active Problems:   Primary osteoarthritis of left hip   Discharge Diagnoses:  Same  Past Medical History:  Diagnosis Date   Arthritis    Atrial fibrillation ([place])    Complication of anesthesia    Dysrhythmia    afib    History of osteopenia    both hips   Hypertension     Surgeries: Procedure(s): TOTAL HIP ARTHROPLASTY ANTERIOR APPROACH on 08/23/2021   Consultants:   Discharged Condition: Improved  Hospital Course: Melissa Price is an 71 y.o. female who was admitted 08/23/2021 for operative treatment ofOsteoarthritis of left hip. Patient has severe unremitting pain that affects sleep, daily activities, and work/hobbies. After pre-op clearance the patient was taken to the operating room on 08/23/2021 and underwent  Procedure(s): TOTAL HIP ARTHROPLASTY ANTERIOR APPROACH.    Patient was given perioperative antibiotics:  Anti-infectives (From admission, onward)    Start     Dose/Rate Route Frequency Ordered Stop   08/23/21 0815  vancomycin (VANCOCIN) IVPB 1000 mg/200 mL premix        1,000 mg 200 mL/hr over 60 Minutes Intravenous On call to O.R. 08/23/21 [id number] 08/23/21 1414   08/23/21 0815  clindamycin (CLEOCIN) IVPB 900 mg        900 mg 100 mL/hr over 30 Minutes Intravenous On call to O.R. 08/23/21 [id number] 08/23/21 1038        Patient was given sequential compression devices, early ambulation, and chemoprophylaxis to prevent DVT.  Patient benefited maximally from hospital stay and there were no complications.    Recent vital signs: Patient Vitals for the past 24 hrs:  BP Temp Temp src Pulse Resp SpO2  08/24/21 1051 118/60 -- -- 60 -- --  08/24/21 0809 (!) 118/59 98.7 F (37.1 C) Oral 69 16 98 %  08/24/21 0514 109/63 98.5 F (36.9 C) Oral 71 17 94 %  08/24/21 0215 109/64  98.1 F (36.7 C) Oral 70 16 94 %  08/23/21 2114 (!) 116/55 97.7 F (36.5 C) Oral 78 16 93 %  08/23/21 1738 (!) 108/58 97.6 F (36.4 C) -- 69 16 99 %  08/23/21 1345 116/70 -- -- (!) 58 11 100 %  08/23/21 1330 117/64 -- -- 66 11 100 %  08/23/21 1315 102/64 -- -- (!) 57 11 98 %  08/23/21 1303 -- -- -- -- -- 94 %  08/23/21 1300 123/60 -- -- 63 11 100 %  08/23/21 1245 125/71 -- -- 70 15 94 %  08/23/21 1230 (!) 141/72 97.6 F (36.4 C) -- 72 11 100 %  08/23/21 1228 (!) 147/135 -- -- -- -- --     Recent laboratory studies:  Recent Labs    08/24/21 0322  WBC 9.4  HGB 9.4*  HCT 29.4*  PLT 186  NA 138  K 4.2  CL 107  CO2 27  BUN 19  CREATININE 0.77  GLUCOSE 158*  CALCIUM 8.8*     Discharge Medications:   Allergies as of 08/24/2021       Reactions   Penicillins Shortness Of Breath   amoxicillin        Medication List     TAKE these medications    amLODipine 5 MG tablet Commonly known as: NORVASC Take 5 mg by mouth daily. Notes to patient: Resume home regimen   atorvastatin 40 MG tablet Commonly  known as: LIPITOR Take 40 mg by mouth at bedtime. Notes to patient: Resume home regimen   docusate sodium 100 MG capsule Commonly known as: COLACE Take 1 capsule (100 mg total) by mouth 2 (two) times daily.   escitalopram 10 MG tablet Commonly known as: LEXAPRO Take 10 mg by mouth every evening.   fluticasone 50 MCG/ACT nasal spray Commonly known as: FLONASE Place 1 spray into both nostrils daily.   HYDROcodone-acetaminophen 5-325 MG tablet Commonly known as: NORCO/VICODIN Take 1 tablet by mouth every 4 (four) hours as needed for moderate pain (pain score 4-6). Notes to patient: Last dose given 02/17 08:26am   loratadine 10 MG tablet Commonly known as: CLARITIN Take 10 mg by mouth daily.   metoprolol tartrate 25 MG tablet Commonly known as: LOPRESSOR Take 25 mg by mouth 2 (two) times daily.   olopatadine 0.1 % ophthalmic solution Commonly known as:  PATANOL Place 1 drop into both eyes 2 (two) times daily.   ondansetron 4 MG tablet Commonly known as: ZOFRAN Take 1 tablet (4 mg total) by mouth every 6 (six) hours as needed for nausea. What changed: Another medication with the same name was added. Make sure you understand how and when to take each. Notes to patient: Last dose given 02/16 12:18am   ondansetron 4 MG tablet Commonly known as: ZOFRAN Take 1 tablet (4 mg total) by mouth every 6 (six) hours as needed for nausea. What changed: You were already taking a medication with the same name, and this prescription was added. Make sure you understand how and when to take each.   rivaroxaban 20 MG Tabs tablet Commonly known as: XARELTO Take 20 mg by mouth every evening.   senna 8.6 MG Tabs tablet Commonly known as: SENOKOT Take 2 tablets (17.2 mg total) by mouth at bedtime.   tetrahydrozoline 0.05 % ophthalmic solution Place 1-2 drops into both eyes 3 (three) times daily as needed (red/irritated eyes.). Notes to patient: Resume home regimen        Diagnostic Studies: DG Pelvis Portable  Result Date: 08/23/2021 CLINICAL DATA:  Left hip replacement. EXAM: PORTABLE PELVIS 1-2 VIEWS COMPARISON:  Pelvis x-ray dated June 14, 2020. FINDINGS: The left hip demonstrates a total arthroplasty without evidence of hardware failure or complication. There is no fracture or dislocation. The alignment is anatomic. Post-surgical changes noted in the surrounding soft tissues. Surgical drain in place. Prior right total hip arthroplasty. IMPRESSION: 1. Left total hip arthroplasty without acute postoperative complication. Electronically Signed   By: Titus Dubin M.D.   On: 08/23/2021 12:50   DG C-Arm 1-60 Min-No Report  Result Date: 08/23/2021 Fluoroscopy was utilized by the requesting physician.  No radiographic interpretation.   DG HIP OPERATIVE UNILAT W OR W/O PELVIS LEFT  Result Date: 08/23/2021 CLINICAL DATA:  Fluoroscopic assistance for  left hip arthroplasty EXAM: OPERATIVE left HIP (WITH PELVIS IF PERFORMED) AP VIEWS TECHNIQUE: Fluoroscopic spot image(s) were submitted for interpretation post-operatively. COMPARISON:  06/14/2020 FINDINGS: Severe degenerative changes are noted in the left hip in the early images. There is interval left hip arthroplasty. There is previous right hip arthroplasty. Fluoroscopic time was 16 seconds. Radiation dose is 1.98 mGy. IMPRESSION: Fluoroscopic assistance was provided for left hip arthroplasty. Electronically Signed   By: Elmer Picker M.D.   On: 08/23/2021 12:12    Disposition: Discharge disposition: 01-Home or Self Care       Discharge Instructions     Call MD / Call 911   Complete by:  As directed    If you experience chest pain or shortness of breath, CALL 911 and be transported to the hospital emergency room.  If you develope a fever above 101 F, pus (white drainage) or increased drainage or redness at the wound, or calf pain, call your surgeon's office.   Constipation Prevention   Complete by: As directed    Drink plenty of fluids.  Prune juice may be helpful.  You may use a stool softener, such as Colace (over the counter) 100 mg twice a day.  Use MiraLax (over the counter) for constipation as needed.   Diet - low sodium heart healthy   Complete by: As directed    Discharge instructions   Complete by: As directed    Every 8 hours, empty JP drain, record drainage, and recharge the drain. Bring output records to your follow. Elevate toes above nose. Ice hip for 20-30 minutes every 2 hours.   Driving restrictions   Complete by: As directed    No driving for 4 weeks   Increase activity slowly as tolerated   Complete by: As directed    Lifting restrictions   Complete by: As directed    No lifting for 6 weeks   Post-operative opioid taper instructions:   Complete by: As directed    POST-OPERATIVE OPIOID TAPER INSTRUCTIONS: It is important to wean off of your opioid  medication as soon as possible. If you do not need pain medication after your surgery it is ok to stop day one. Opioids include: Codeine, Hydrocodone(Norco, Vicodin), Oxycodone(Percocet, oxycontin) and hydromorphone amongst others.  Long term and even short term use of opiods can cause: Increased pain response Dependence Constipation Depression Respiratory depression And more.  Withdrawal symptoms can include Flu like symptoms Nausea, vomiting And more Techniques to manage these symptoms Hydrate well Eat regular healthy meals Stay active Use relaxation techniques(deep breathing, meditating, yoga) Do Not substitute Alcohol to help with tapering If you have been on opioids for less than two weeks and do not have pain than it is ok to stop all together.  Plan to wean off of opioids This plan should start within one week post op of your joint replacement. Maintain the same interval or time between taking each dose and first decrease the dose.  Cut the total daily intake of opioids by one tablet each day Next start to increase the time between doses. The last dose that should be eliminated is the evening dose.      TED hose   Complete by: As directed    Use stockings (TED hose) for 2 weeks on both leg(s).  You may remove them at night for sleeping.        Follow-up Information     Swinteck, Aaron Edelman, MD. Schedule an appointment as soon as possible for a visit in 2 week(s).   Specialty: Orthopedic Surgery Why: For wound re-check, For suture removal Contact information: [address] [place] [address] [phone]                  Signed: Nehemiah Massed 08/24/2021, 11:40 AM

## 2021-08-24 NOTE — Progress Notes (Signed)
Provided discharge education to Pt and husband, all questions and concerns addressed. Pt not in acute distress, denies chest pain, no lightheadedness, no SOB.  Pt demonstrated the correct way to empty and charge JP drain, would document output and bring record during her office visit. Dressing to JP drain reinforced with tegaderm.  Discharged home with belongings accompanied by husband.

## 2021-08-24 NOTE — Plan of Care (Signed)

## 2021-08-24 NOTE — Progress Notes (Signed)
Physical Therapy Treatment Patient Details Name: Melissa Price MRN: [id number] DOB: 1951-05-08 Today's Date: 08/24/2021   History of Present Illness Pt s/p L DATHA 08/23/2021, with PMH for HTN, osteopenia, GERD , a-fib and R DATHA 06/14/2020    PT Comments    Pt is mobilizing well, she ambulated 160' with RW, no loss of balance. Stair training completed. Pt demonstrates good understanding of HEP. She is ready to DC home from a PT standpoint.    Recommendations for follow up therapy are one component of a multi-disciplinary discharge planning process, led by the attending physician.  Recommendations may be updated based on patient status, additional functional criteria and insurance authorization.  Follow Up Recommendations  Follow physician's recommendations for discharge plan and follow up therapies     Assistance Recommended at Discharge Set up Supervision/Assistance  Patient can return home with the following A little help with walking and/or transfers;A little help with bathing/dressing/bathroom;Assist for transportation;Help with stairs or ramp for entrance   Equipment Recommendations  None recommended by PT    Recommendations for Other Services       Precautions / Restrictions Precautions Precautions: Fall Restrictions Weight Bearing Restrictions: Yes LLE Weight Bearing: Weight bearing as tolerated     Mobility  Bed Mobility Overal bed mobility: Needs Assistance Bed Mobility: Supine to Sit     Supine to sit: Modified independent (Device/Increase time), HOB elevated     General bed mobility comments: pt self assisted LLE with RLE    Transfers Overall transfer level: Needs assistance Equipment used: Rollator (4 wheels) Transfers: Sit to/from Stand Sit to Stand: Supervision           General transfer comment: cues for hand placement on RW and safety    Ambulation/Gait Ambulation/Gait assistance: Supervision Gait Distance (Feet): 160 Feet Assistive  device: Rolling walker (2 wheels) Gait Pattern/deviations: Step-to pattern Gait velocity: WFL     General Gait Details: Step to pattern, however transitioned to a step through pattern quickly . No loss of balance.   Stairs Stairs: Yes Stairs assistance: Min assist Stair Management: No rails, Backwards, Step to pattern, With walker Number of Stairs: 3 General stair comments: VCs sequencing, min A for RW   Wheelchair Mobility    Modified Rankin (Stroke Patients Only)       Balance Overall balance assessment: Needs assistance Sitting-balance support: Bilateral upper extremity supported, Feet supported Sitting balance-Leahy Scale: Good     Standing balance support: Bilateral upper extremity supported, During functional activity Standing balance-Leahy Scale: Fair                              Cognition Arousal/Alertness: Awake/alert Behavior During Therapy: WFL for tasks assessed/performed Overall Cognitive Status: Within Functional Limits for tasks assessed                                          Exercises Total Joint Exercises Ankle Circles/Pumps: AROM, Both, 10 reps, Supine Quad Sets: AROM, Supine, Both, 5 reps Short Arc Quad: AROM, Left, 10 reps, Supine Heel Slides: AAROM, Supine, Left, 10 reps Hip ABduction/ADduction: AAROM, Supine, Left, 10 reps Straight Leg Raises: AAROM, Left, 10 reps, Supine Long Arc Quad: AROM, Left, 10 reps, Seated    General Comments        Pertinent Vitals/Pain Pain Assessment Pain Score: 4  Pain Location: L hip  area anterior thigh with walking Pain Descriptors / Indicators: Sore, Tightness Pain Intervention(s): Limited activity within patient's tolerance, Monitored during session, Premedicated before session, Ice applied    Home Living                          Prior Function            PT Goals (current goals can now be found in the care plan section) Acute Rehab PT Goals Patient  Stated Goal: I want to get back to walking and exercising, yard work PT Goal Formulation: With patient Time For Goal Achievement: 08/30/21 Potential to Achieve Goals: Good Progress towards PT goals: Progressing toward goals    Frequency    7X/week      PT Plan Current plan remains appropriate    Co-evaluation              AM-PAC PT "6 Clicks" Mobility   Outcome Measure  Help needed turning from your back to your side while in a flat bed without using bedrails?: None Help needed moving from lying on your back to sitting on the side of a flat bed without using bedrails?: None Help needed moving to and from a bed to a chair (including a wheelchair)?: None Help needed standing up from a chair using your arms (e.g., wheelchair or bedside chair)?: None Help needed to walk in hospital room?: None Help needed climbing 3-5 steps with a railing? : A Little 6 Click Score: 23    End of Session Equipment Utilized During Treatment: Gait belt Activity Tolerance: Patient tolerated treatment well Patient left: with call bell/phone within reach;in chair;with chair alarm set Nurse Communication: Mobility status PT Visit Diagnosis: Unsteadiness on feet (R26.81)     Time: [id number] PT Time Calculation (min) (ACUTE ONLY): 27 min  Charges:  $Gait Training: 8-22 mins $Therapeutic Exercise: 8-22 mins                    Blondell Reveal Kistler PT 08/24/2021  Acute Rehabilitation Services Pager [phone] Office [phone]

## 2021-08-24 NOTE — Progress Notes (Signed)
Subjective: 1 Day Post-Op Procedure(s) (LRB): TOTAL HIP ARTHROPLASTY ANTERIOR APPROACH (Left) Patient reports pain as moderate.    Objective: Vital signs in last 24 hours: Temp:  [97.6 F (36.4 C)-98.7 F (37.1 C)] 98.7 F (37.1 C) (02/17 0809) Pulse Rate:  [57-78] 60 (02/17 1051) Resp:  [11-17] 16 (02/17 0809) BP: (102-147)/(55-135) 118/60 (02/17 1051) SpO2:  [93 %-100 %] 98 % (02/17 0809)  Intake/Output from previous day: 02/16 0701 - 02/17 0700 In: 3614.1 [P.O.:480; I.V.:2384.1; IV Piggyback:750] Out: 400 [Drains:100; Blood:300] Intake/Output this shift: Total I/O In: 190 [P.O.:120; I.V.:70] Out: -   Recent Labs    08/24/21 0322  HGB 9.4*   Recent Labs    08/24/21 0322  WBC 9.4  RBC 2.92*  HCT 29.4*  PLT 186   Recent Labs    08/24/21 0322  NA 138  K 4.2  CL 107  CO2 27  BUN 19  CREATININE 0.77  GLUCOSE 158*  CALCIUM 8.8*   No results for input(s): LABPT, INR in the last 72 hours.  Neurovascular intact Sensation intact distally Intact pulses distally Dorsiflexion/Plantar flexion intact Incision: dressing C/D/I Compartment soft   Assessment/Plan: 1 Day Post-Op Procedure(s) (LRB): TOTAL HIP ARTHROPLASTY ANTERIOR APPROACH (Left) Advance diet Up with therapy D/C IV fluids Discharge home with home health Continue drain until follow up in one week     Melissa Price 08/24/2021, 11:35 AM [phone]

## 2021-08-24 NOTE — TOC Transition Note (Signed)
Transition of Care [facility]) - CM/SW Discharge Note  Patient Details  Name: Melissa Price MRN: [id number] Date of Birth: [birth year]  [facility] CM/SW Contact:  Sherie Don, LCSW Phone Number: 08/24/2021, 10:42 AM  Clinical Narrative: Patient is expected to discharge home after working with PT. CSW spoke with patient to confirm discharge plan. Patient will go home with a home exercise program (HEP). Patient has a rolling walker and 3N1 at home, so there are no DME needs at this time. TOC signing off.  Final next level of care: Home/Self Care Barriers to Discharge: No Barriers Identified  Patient Goals and CMS Choice Patient states their goals for this hospitalization and ongoing recovery are:: Discharge home with HEP Choice offered to / list presented to : NA  Discharge Plan and Services         DME Arranged: N/A DME Agency: NA  Readmission Risk Interventions No flowsheet data found.

## 2021-08-27 ENCOUNTER — Encounter (HOSPITAL_COMMUNITY): Payer: Self-pay | Admitting: Orthopedic Surgery

## 2022-01-22 IMAGING — RF DG HIP (WITH PELVIS) OPERATIVE*R*
1 series · 2 of 2 positions shown · non-contrast
Comparison: None.

CLINICAL DATA: Hip replacement

EXAM:
OPERATIVE RIGHT HIP (WITH PELVIS IF PERFORMED) 2 VIEWS
TECHNIQUE: Fluoroscopic spot image(s) were submitted for interpretation
post-operatively.

[Series 1: unknown protocol · 0.20mm/px · 2 of 2 slices shown]
[im 1/2]
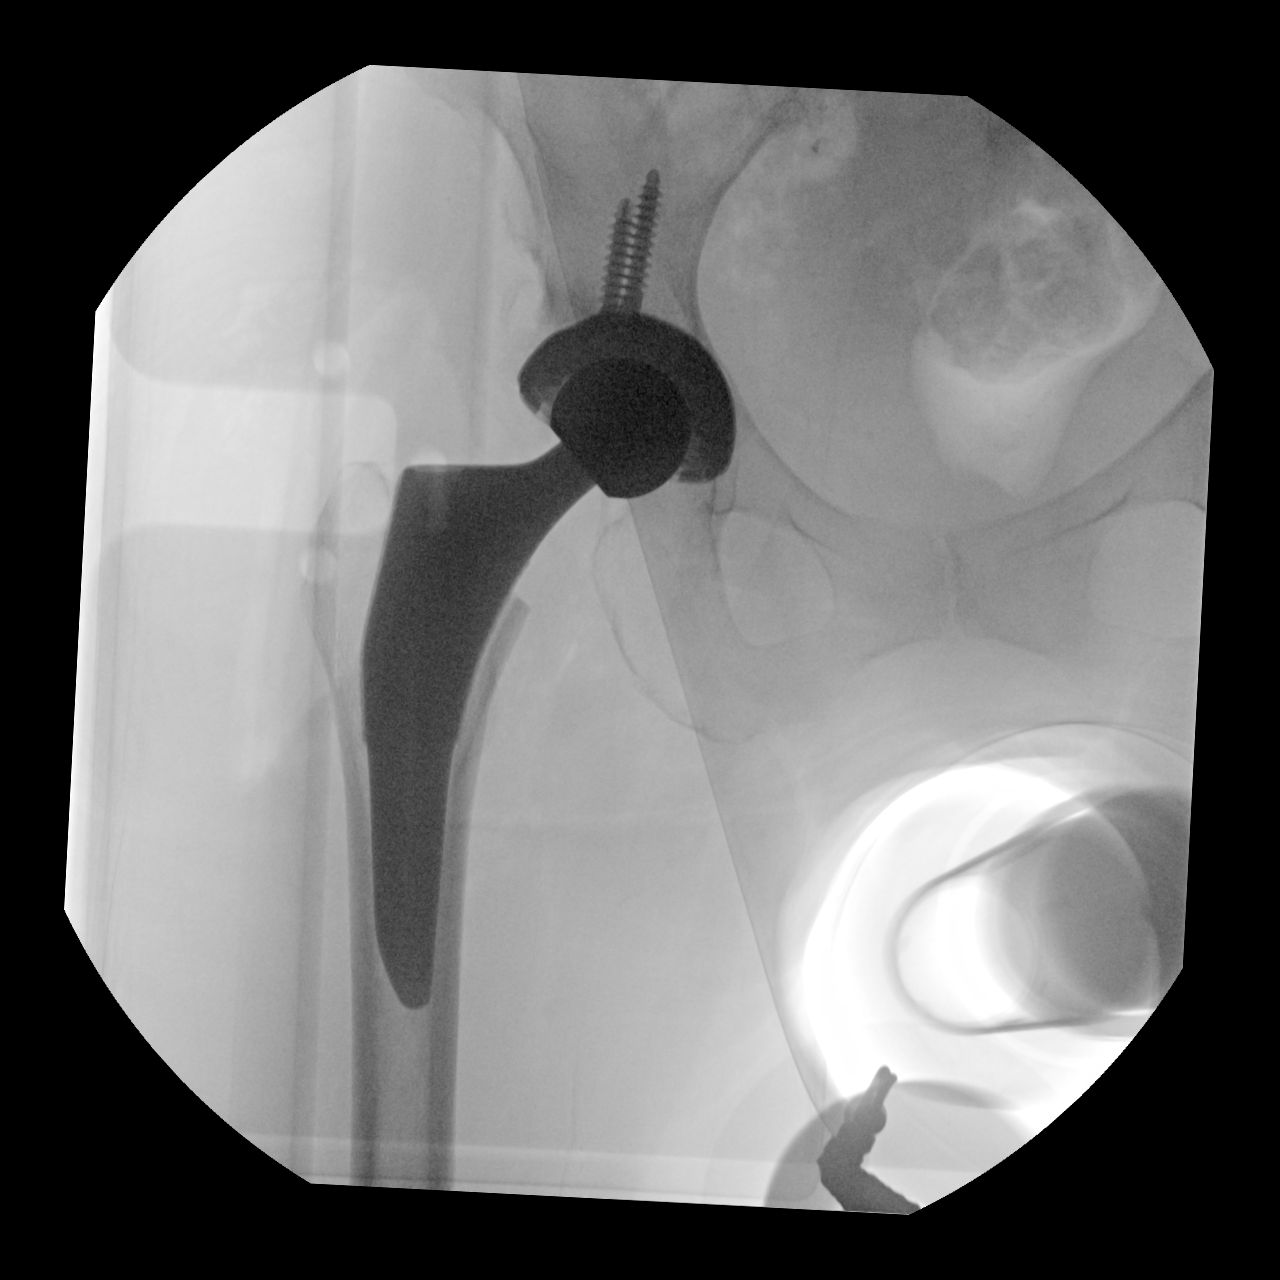
[im 2/2]
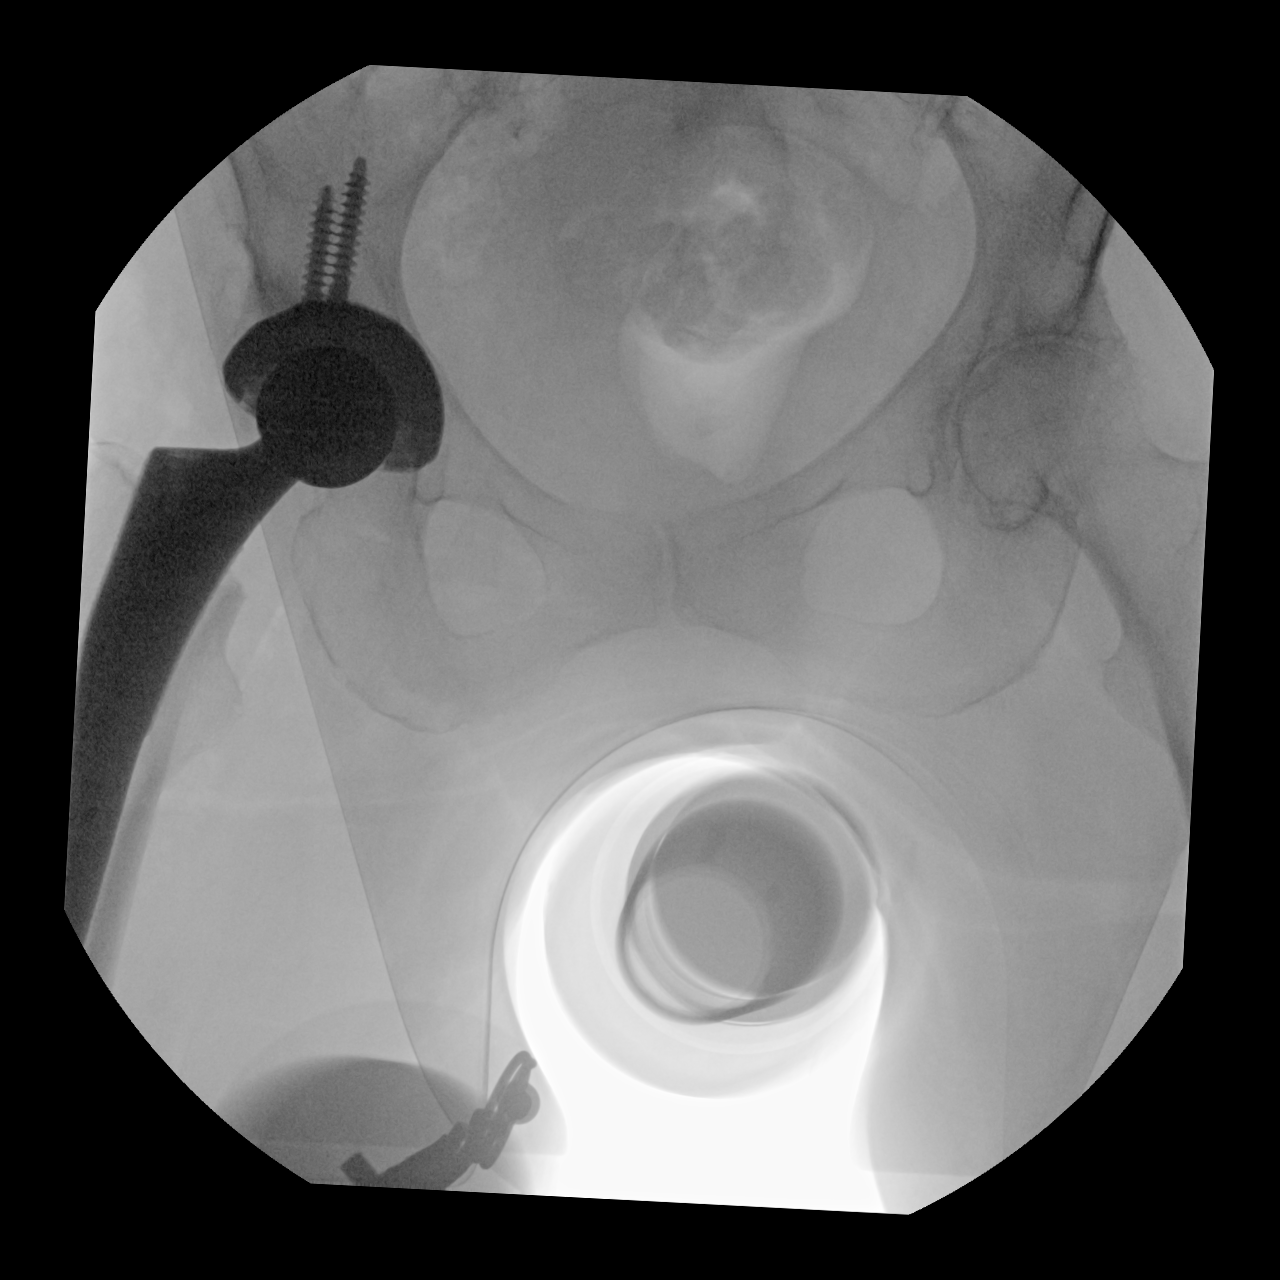

[2 of 2 positions shown; findings below may reference images not displayed]

FINDINGS: Two intraoperative spot images demonstrate changes of right hip
replacement. Normal AP alignment. No hardware complicating feature.
IMPRESSION: Right hip replacement.  No visible complicating feature.

## 2022-01-22 IMAGING — DX DG PORTABLE PELVIS
1 series · 1 of 1 positions shown · non-contrast
Comparison: Earlier same day.

CLINICAL DATA: Follow-up right hip arthroplasty.

EXAM:
PORTABLE PELVIS 1-2 VIEWS

[pelvis ap]
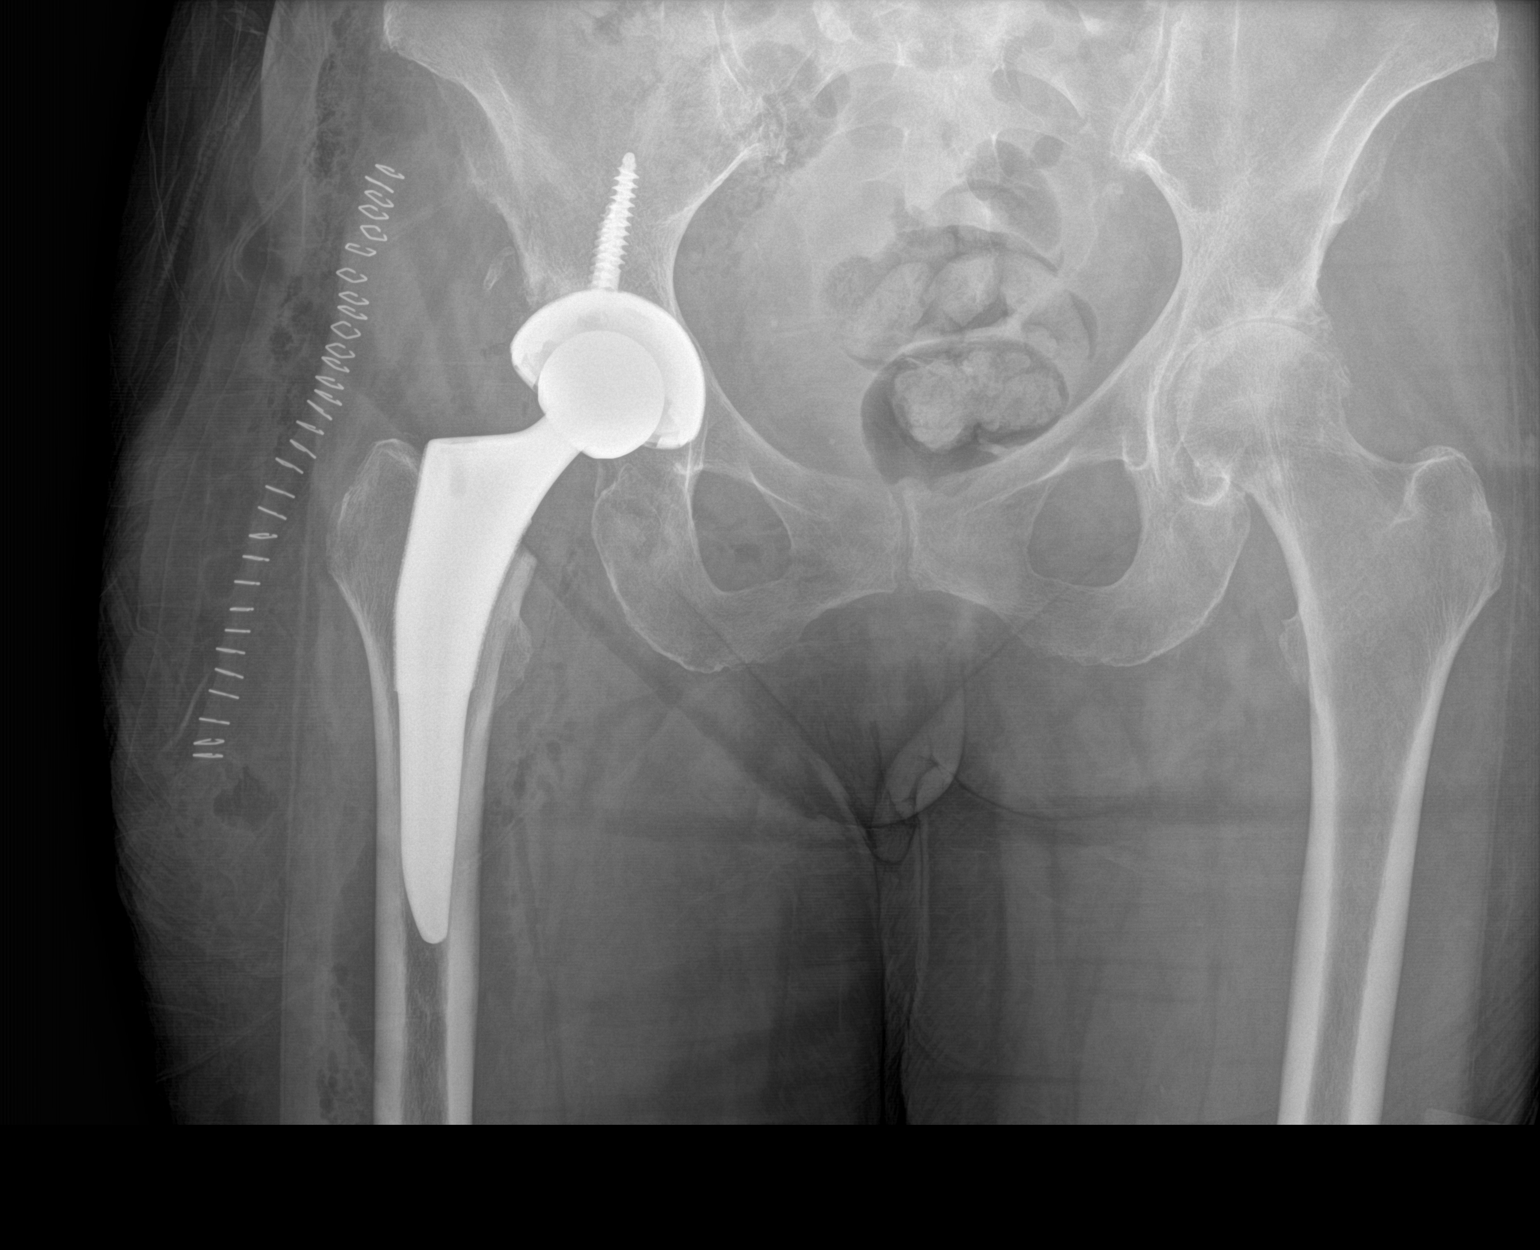

[1 of 1 positions shown; findings below may reference images not displayed]

FINDINGS: Total hip arthroplasty on the right. Components appear well
positioned. No radiographically detectable complication or
unexpected finding. Chronic osteoarthritis of the left hip.
IMPRESSION: Good appearance following total hip arthroplasty on the right.
